# Patient Record
Sex: Female | Born: 1950 | Race: White | Hispanic: No | Marital: Married | Smoking: Former smoker
Health system: Southern US, Community
[De-identification: ages and names within clinical notes are randomized; demographics above are authoritative.]

## PROBLEM LIST (undated history)

## (undated) DIAGNOSIS — Z5189 Encounter for other specified aftercare: Secondary | ICD-10-CM

## (undated) DIAGNOSIS — D649 Anemia, unspecified: Secondary | ICD-10-CM

## (undated) DIAGNOSIS — R42 Dizziness and giddiness: Secondary | ICD-10-CM

## (undated) DIAGNOSIS — I10 Essential (primary) hypertension: Secondary | ICD-10-CM

## (undated) DIAGNOSIS — M810 Age-related osteoporosis without current pathological fracture: Secondary | ICD-10-CM

## (undated) HISTORY — DX: Encounter for other specified aftercare: Z51.89

## (undated) HISTORY — DX: Age-related osteoporosis without current pathological fracture: M81.0

## (undated) HISTORY — PX: FRACTURE SURGERY: SHX138

## (undated) HISTORY — DX: Essential (primary) hypertension: I10

## (undated) HISTORY — DX: Dizziness and giddiness: R42

## (undated) HISTORY — DX: Anemia, unspecified: D64.9

## (undated) HISTORY — PX: HALLUX VALGUS CORRECTION: SUR315

---

## 1999-07-23 ENCOUNTER — Encounter: Payer: Self-pay | Admitting: Occupational Medicine

## 1999-07-23 ENCOUNTER — Encounter: Admission: RE | Admit: 1999-07-23 | Discharge: 1999-07-23 | Payer: Self-pay | Admitting: Occupational Medicine

## 2004-05-21 ENCOUNTER — Encounter: Admission: RE | Admit: 2004-05-21 | Discharge: 2004-05-21 | Payer: Self-pay | Admitting: Family Medicine

## 2005-01-12 ENCOUNTER — Other Ambulatory Visit: Admission: RE | Admit: 2005-01-12 | Discharge: 2005-01-12 | Payer: Self-pay | Admitting: Family Medicine

## 2005-08-31 ENCOUNTER — Encounter: Admission: RE | Admit: 2005-08-31 | Discharge: 2005-08-31 | Payer: Self-pay | Admitting: Occupational Medicine

## 2006-09-29 ENCOUNTER — Encounter: Admission: RE | Admit: 2006-09-29 | Discharge: 2006-09-29 | Payer: Self-pay | Admitting: Family Medicine

## 2009-07-18 ENCOUNTER — Inpatient Hospital Stay (HOSPITAL_COMMUNITY): Admission: EM | Admit: 2009-07-18 | Discharge: 2009-07-19 | Payer: Self-pay | Admitting: Emergency Medicine

## 2009-08-15 ENCOUNTER — Inpatient Hospital Stay (HOSPITAL_COMMUNITY): Admission: EM | Admit: 2009-08-15 | Discharge: 2009-08-18 | Payer: Self-pay | Admitting: Emergency Medicine

## 2010-03-21 LAB — CBC
HCT: 32.5 % — ABNORMAL LOW (ref 36.0–46.0)
HCT: 40.9 % (ref 36.0–46.0)
MCH: 30.6 pg (ref 26.0–34.0)
MCH: 30.7 pg (ref 26.0–34.0)
MCHC: 34.2 g/dL (ref 30.0–36.0)
MCHC: 34.5 g/dL (ref 30.0–36.0)
MCV: 88.9 fL (ref 78.0–100.0)
MCV: 89.5 fL (ref 78.0–100.0)
RDW: 13.5 % (ref 11.5–15.5)
WBC: 7.4 10*3/uL (ref 4.0–10.5)

## 2010-03-21 LAB — BASIC METABOLIC PANEL
CO2: 23 mEq/L (ref 19–32)
Chloride: 109 mEq/L (ref 96–112)
Creatinine, Ser: 0.69 mg/dL (ref 0.4–1.2)
Glucose, Bld: 98 mg/dL (ref 70–99)

## 2010-03-21 LAB — POCT I-STAT, CHEM 8
Calcium, Ion: 1.09 mmol/L — ABNORMAL LOW (ref 1.12–1.32)
Glucose, Bld: 90 mg/dL (ref 70–99)

## 2010-03-21 LAB — DIFFERENTIAL
Basophils Absolute: 0 10*3/uL (ref 0.0–0.1)
Eosinophils Absolute: 0 10*3/uL (ref 0.0–0.7)
Lymphs Abs: 0.9 10*3/uL (ref 0.7–4.0)
Neutrophils Relative %: 84 % — ABNORMAL HIGH (ref 43–77)

## 2010-03-23 LAB — CBC
HCT: 40.9 % (ref 36.0–46.0)
Hemoglobin: 14 g/dL (ref 12.0–15.0)
MCH: 30.9 pg (ref 26.0–34.0)
MCHC: 34.1 g/dL (ref 30.0–36.0)
MCV: 90.6 fL (ref 78.0–100.0)
Platelets: 185 10*3/uL (ref 150–400)
RBC: 4.52 MIL/uL (ref 3.87–5.11)

## 2010-03-23 LAB — DIFFERENTIAL
Eosinophils Absolute: 0 10*3/uL (ref 0.0–0.7)
Lymphocytes Relative: 10 % — ABNORMAL LOW (ref 12–46)
Lymphs Abs: 1 10*3/uL (ref 0.7–4.0)
Monocytes Absolute: 0.5 10*3/uL (ref 0.1–1.0)

## 2010-03-23 LAB — URINALYSIS, ROUTINE W REFLEX MICROSCOPIC
Bilirubin Urine: NEGATIVE
Glucose, UA: NEGATIVE mg/dL
Hgb urine dipstick: NEGATIVE
Nitrite: NEGATIVE
Protein, ur: NEGATIVE mg/dL
Specific Gravity, Urine: 1.019 (ref 1.005–1.030)

## 2010-03-23 LAB — BASIC METABOLIC PANEL
BUN: 12 mg/dL (ref 6–23)
Calcium: 9.1 mg/dL (ref 8.4–10.5)
GFR calc non Af Amer: >60 mL/min (ref >60)
Glucose, Bld: 119 mg/dL — ABNORMAL HIGH (ref 70–99)
Sodium: 138 mEq/L (ref 135–145)

## 2010-03-23 LAB — URINE MICROSCOPIC-ADD ON

## 2010-03-23 LAB — PROTIME-INR: INR: 1.01 (ref 0.00–1.49)

## 2012-07-12 ENCOUNTER — Ambulatory Visit (INDEPENDENT_AMBULATORY_CARE_PROVIDER_SITE_OTHER): Payer: Federal, State, Local not specified - PPO | Admitting: Family Medicine

## 2012-07-12 VITALS — BP 144/88 | HR 73 | Temp 97.7°F | Resp 18 | Ht 65.0 in | Wt 128.0 lb

## 2012-07-12 DIAGNOSIS — R030 Elevated blood-pressure reading, without diagnosis of hypertension: Secondary | ICD-10-CM

## 2012-07-12 LAB — POCT CBC
Granulocyte percent: 64.6 %G (ref 37–80)
HCT, POC: 47 % (ref 37.7–47.9)
Hemoglobin: 15.1 g/dL (ref 12.2–16.2)
Lymph, poc: 1.1 (ref 0.6–3.4)
MCH, POC: 30.1 pg (ref 27–31.2)
MID (cbc): 0.3 (ref 0–0.9)
POC LYMPH PERCENT: 26.8 %L (ref 10–50)
RBC: 5.01 M/uL (ref 4.04–5.48)
RDW, POC: 13.8 %
WBC: 4 10*3/uL — AB (ref 4.6–10.2)

## 2012-07-12 LAB — COMPREHENSIVE METABOLIC PANEL
AST: 20 U/L (ref 0–37)
Alkaline Phosphatase: 46 U/L (ref 39–117)
BUN: 15 mg/dL (ref 6–23)
Chloride: 104 mEq/L (ref 96–112)
Creat: 0.84 mg/dL (ref 0.50–1.10)
Glucose, Bld: 92 mg/dL (ref 70–99)

## 2012-07-12 LAB — LIPID PANEL
Triglycerides: 79 mg/dL (ref <150)
VLDL: 16 mg/dL (ref 0–40)

## 2012-07-12 NOTE — Progress Notes (Signed)
[facility] [address] [place] [address] [phone]  Date:  07/12/2012   Name:  Lisa Sloan   DOB:  March 14, 1950   MRN:  [id number]  PCP:  No primary provider on file.    Chief Complaint: elevated bp x1 mth   History of Present Illness:  Lisa Sloan is a 62 y.o. very pleasant female patient who presents with the following:  She is here today with concern about high blood pressure.  She went to donate blood abut 3 weeks ago, and was told that her BP was too high for donation. In general her BP has been good in the past.   She has checked her BP a couple of times at the drug store- her diatolic readings are sometimes quite high, up to ?125. She is not sure of the systolic numbers.  Her BP generally ran 120's to 130's/ 60- 80's   She does not see a doctor regularly.  Her father does have HTN.   She walks for exercise- an hour a day.  Tolerating her walking as well as usual.  Over all she feels great, no CP, no SOB.    There are no active problems to display for this patient.   Past Medical History  Diagnosis Date  . Osteoporosis     Past Surgical History  Procedure Laterality Date  . Cesarean section    . Fracture surgery      History  Substance Use Topics  . Smoking status: Never Smoker   . Smokeless tobacco: Not on file  . Alcohol Use: Yes    Family History  Problem Relation Age of Onset  . Diabetes Mother     No Known Allergies  Medication list has been reviewed and updated.  No current outpatient prescriptions on file prior to visit.   No current facility-administered medications on file prior to visit.    Review of Systems:  As per HPI- otherwise negative.   Physical Examination: Filed Vitals:   07/12/12 1156  BP: 144/88  Pulse: 73  Temp: 97.7 F (36.5 C)  Resp: 18   Filed Vitals:   07/12/12 1156  Height: 5\' 5"  (1.651 m)  Weight: 128 lb (58.06 kg)   Body mass index is 21.3 kg/(m^2). Ideal Body  Weight: Weight in (lb) to have BMI = 25: 149.9  GEN: WDWN, NAD, Non-toxic, A & O x 3, appears fit and active.  HEENT: Atraumatic, Normocephalic. Neck supple. No masses, No LAD. Ears and Nose: No external deformity. CV: RRR, No M/G/R. No JVD. No thrill. No extra heart sounds. PULM: CTA B, no wheezes, crackles, rhonchi. No retractions. No resp. distress. No accessory muscle use. ABD: S, NT, ND. No rebound. No HSM. EXTR: No c/c/e NEURO Normal gait.  PSYCH: Normally interactive. Conversant. Not depressed or anxious appearing.  Calm demeanor.   EKG: NSR, no ST elevation or depression  Results for orders placed in visit on 07/12/12  POCT CBC      Result Value Range   WBC 4.0 (*) 4.6 - 10.2 K/uL   Lymph, poc 1.1  0.6 - 3.4   POC LYMPH PERCENT 26.8  10 - 50 %L   MID (cbc) 0.3  0 - 0.9   POC MID % 8.6  0 - 12 %M   POC Granulocyte 2.6  2 - 6.9   Granulocyte percent 64.6  37 - 80 %G   RBC 5.01  4.04 - 5.48 M/uL   Hemoglobin  15.1  12.2 - 16.2 g/dL   HCT, POC 40.9  81.1 - 47.9 %   MCV 93.8  80 - 97 fL   MCH, POC 30.1  27 - 31.2 pg   MCHC 32.1  31.8 - 35.4 g/dL   RDW, POC 91.4     Platelet Count, POC 173  142 - 424 K/uL   MPV 8.5  0 - 99.8 fL    Assessment and Plan: Blood pressure elevated - Plan: POCT CBC, Comprehensive metabolic panel, TSH, Lipid panel, EKG 12-Lead  Lisa Sloan has noted some elevated BP readings.  However, today her BP is acceptable, and she does not want to start medication if not absolutely necessary.  She will obtain more readings at home, and I will follow- up with her when her labs come in.    Signed Abbe Amsterdam, MD

## 2012-07-12 NOTE — Patient Instructions (Addendum)
Please purchase an inexpensive BP cuff.  Check your BP a few times a week- different times of the day are helpful.   Write down your BP readings.  We would like for you to be less than 140/90 on average.  I will be in touch with the rest of your labs.    Please email or call me if your BP is consistently running high

## 2012-07-13 ENCOUNTER — Encounter: Payer: Self-pay | Admitting: Family Medicine

## 2013-11-20 ENCOUNTER — Encounter: Payer: Self-pay | Admitting: Family Medicine

## 2013-11-20 ENCOUNTER — Ambulatory Visit (INDEPENDENT_AMBULATORY_CARE_PROVIDER_SITE_OTHER): Payer: Federal, State, Local not specified - PPO | Admitting: Family Medicine

## 2013-11-20 VITALS — BP 150/98 | HR 89 | Temp 97.4°F | Resp 14 | Ht 64.5 in | Wt 133.0 lb

## 2013-11-20 DIAGNOSIS — R030 Elevated blood-pressure reading, without diagnosis of hypertension: Secondary | ICD-10-CM

## 2013-11-20 DIAGNOSIS — Z1211 Encounter for screening for malignant neoplasm of colon: Secondary | ICD-10-CM

## 2013-11-20 DIAGNOSIS — Z23 Encounter for immunization: Secondary | ICD-10-CM

## 2013-11-20 DIAGNOSIS — IMO0001 Reserved for inherently not codable concepts without codable children: Secondary | ICD-10-CM

## 2013-11-20 DIAGNOSIS — Z1239 Encounter for other screening for malignant neoplasm of breast: Secondary | ICD-10-CM

## 2013-11-20 DIAGNOSIS — Z0000 Encounter for general adult medical examination without abnormal findings: Secondary | ICD-10-CM

## 2013-11-20 DIAGNOSIS — N952 Postmenopausal atrophic vaginitis: Secondary | ICD-10-CM

## 2013-11-20 DIAGNOSIS — Z124 Encounter for screening for malignant neoplasm of cervix: Secondary | ICD-10-CM

## 2013-11-20 LAB — COMPREHENSIVE METABOLIC PANEL
ALT: 13 U/L (ref 0–35)
AST: 21 U/L (ref 0–37)
Albumin: 4.2 g/dL (ref 3.5–5.2)
Alkaline Phosphatase: 51 U/L (ref 39–117)
BUN: 19 mg/dL (ref 6–23)
CALCIUM: 9.6 mg/dL (ref 8.4–10.5)
CO2: 27 mEq/L (ref 19–32)
CREATININE: 0.75 mg/dL (ref 0.50–1.10)
Chloride: 106 mEq/L (ref 96–112)
Glucose, Bld: 72 mg/dL (ref 70–99)
POTASSIUM: 4.1 meq/L (ref 3.5–5.3)
Sodium: 141 mEq/L (ref 135–145)
Total Bilirubin: 0.6 mg/dL (ref 0.2–1.2)
Total Protein: 6.8 g/dL (ref 6.0–8.3)

## 2013-11-20 LAB — POCT URINALYSIS DIPSTICK
BILIRUBIN UA: NEGATIVE
Blood, UA: NEGATIVE
GLUCOSE UA: NEGATIVE
KETONES UA: 15
LEUKOCYTES UA: NEGATIVE
NITRITE UA: NEGATIVE
PH UA: 5
Protein, UA: NEGATIVE
Spec Grav, UA: 1.02
UROBILINOGEN UA: 0.2

## 2013-11-20 LAB — CBC
HCT: 40.6 % (ref 36.0–46.0)
HEMOGLOBIN: 14.2 g/dL (ref 12.0–15.0)
MCH: 30.1 pg (ref 26.0–34.0)
MCHC: 35 g/dL (ref 30.0–36.0)
MCV: 86 fL (ref 78.0–100.0)
MPV: 10.4 fL (ref 9.4–12.4)
Platelets: 153 10*3/uL (ref 150–400)
RBC: 4.72 MIL/uL (ref 3.87–5.11)
RDW: 14 % (ref 11.5–15.5)
WBC: 4 10*3/uL (ref 4.0–10.5)

## 2013-11-20 LAB — TSH: TSH: 1.416 u[IU]/mL (ref 0.350–4.500)

## 2013-11-20 MED ORDER — ESTROGENS, CONJUGATED 0.625 MG/GM VA CREA: 1.0000 | TOPICAL_CREAM | VAGINAL | Status: DC

## 2013-11-20 NOTE — Progress Notes (Signed)
Subjective:    Patient ID: Lisa Sloan, female    DOB: 1950/05/03, 63 y.o.   MRN: [id number]  HPI Patient presents today for CPE.  Last CPE- many years Last PAP- not sure, never abnormal Last mammo- 2008 Colonoscopy- never, is interested in referral for screening Tdap- she wants to check on this, not sure Flu- today  The patient is retired. She works occasionally for a [employer]. She walks for 1 hour most days. She is married.   Her only complaint today is some vaginal dryness and occasional stress urinary incontinence. She has not had any vaginal bleeding since menopause. She has no personal or family history of DVT or clotting disorders. No history of migraine.   She has been told her BP is elevated in the past. She checks it at home and it is usually in the 130-140s/80s.   Past Medical History  Diagnosis Date  . Osteoporosis    Past Surgical History  Procedure Laterality Date  . Cesarean section    . Fracture surgery    . Hallux valgus correction     Family History  Problem Relation Age of Onset  . Diabetes Mother    History  Substance Use Topics  . Smoking status: Never Smoker   . Smokeless tobacco: Not on file  . Alcohol Use: Yes     Review of Systems  Genitourinary:       Vaginal dryness and occasional stress incontinence.   All other systems reviewed and are negative.     Objective:   Physical Exam  Constitutional: She is oriented to person, place, and time. She appears well-developed and well-nourished. No distress.  HENT:  Head: Normocephalic and atraumatic.  Right Ear: Tympanic membrane, external ear and ear canal normal.  Left Ear: Tympanic membrane, external ear and ear canal normal.  Nose: Nose normal.  Mouth/Throat: Oropharynx is clear and moist. No oropharyngeal exudate.  Eyes: Conjunctivae and EOM are normal. Pupils are equal, round, and reactive to light. Right eye exhibits no discharge. Left eye exhibits no discharge. No scleral  icterus.  Neck: Normal range of motion. Neck supple. No JVD present. No thyromegaly present.  Cardiovascular: Normal rate, regular rhythm, normal heart sounds and intact distal pulses.   Pulmonary/Chest: Effort normal and breath sounds normal. Right breast exhibits no inverted nipple, no mass, no nipple discharge, no skin change and no tenderness. Left breast exhibits no inverted nipple, no mass, no nipple discharge, no skin change and no tenderness. Breasts are symmetrical.  Abdominal: Soft. Bowel sounds are normal. She exhibits no distension and no mass. There is no tenderness. There is no rebound and no guarding.  Genitourinary: Rectum normal. No breast swelling, tenderness, discharge or bleeding. Pelvic exam was performed with patient supine. No labial fusion. There is no rash, tenderness, lesion or injury on the right labia. There is no rash, tenderness, lesion or injury on the left labia. Cervix exhibits no discharge. No vaginal discharge found.  Vaginal atrophy.  Musculoskeletal: Normal range of motion.  Lymphadenopathy:    She has no cervical adenopathy.  Neurological: She is alert and oriented to person, place, and time. She has normal reflexes.  Skin: Skin is warm and dry. She is not diaphoretic.  Psychiatric: She has a normal mood and affect. Her behavior is normal. Judgment and thought content normal.  Vitals reviewed. BP 150/98 mmHg  Pulse 89  Temp(Src) 97.4 F (36.3 C) (Oral)  Resp 14  Ht 5' 4.5" (1.638 m)  Wt  133 lb (60.328 kg)  BMI 22.48 kg/m2  SpO2 99%     Assessment & Plan:  1. Annual physical exam - CBC - Comprehensive metabolic panel - TSH - POCT urinalysis dipstick  2. Vaginal atrophy - conjugated estrogens (PREMARIN) vaginal cream; Place 1 Applicatorful vaginally 3 (three) times a week.  Dispense: 42.5 g; Refill: 12  3. Screening for colon cancer - Ambulatory referral to Gastroenterology - Pap IG, CT/NG w/ reflex HPV when ASC-U  4. Screening for breast  cancer - MM DIGITAL SCREENING BILATERAL; Future  5. Elevated blood pressure -Patient is going to take her blood pressure at home for the next 2 weeks, then return to clinic with her readings.  - CBC - Comprehensive metabolic panel - TSH - POCT urinalysis dipstick  6. Flu vaccine need - Flu Vaccine QUAD 36+ mos IM  7. Screening for cervical cancer - Pap IG, CT/NG w/ reflex HPV when ASC-U    Emi Belfast, FNP-BC  [facility], [facility]  11/21/2013 2:08 PM

## 2013-11-20 NOTE — Patient Instructions (Addendum)
Please come in to have your blood pressure checked in 1 week   Conjugated Estrogens vaginal cream What is this medicine? CONJUGATED ESTROGENS (CON ju gate ed ESS troe jenz) are a mixture of female hormones. This cream can help relieve symptoms associated with menopause.like vaginal dryness and irritation. This medicine may be used for other purposes; ask your health care provider or pharmacist if you have questions. COMMON BRAND NAME(S): Premarin What should I tell my health care provider before I take this medicine? They need to know if you have any of these conditions: -abnormal vaginal bleeding -blood vessel disease or blood clots -breast, cervical, endometrial, or uterine cancer -dementia -diabetes -gallbladder disease -heart disease or recent heart attack -high blood pressure -high cholesterol -high level of calcium in the blood -hysterectomy -kidney disease -liver disease -migraine headaches -protein C deficiency -protein S deficiency -stroke -systemic lupus erythematosus (SLE) -tobacco smoker -an unusual or allergic reaction to estrogens other medicines, foods, dyes, or preservatives -pregnant or trying to get pregnant -breast-feeding How should I use this medicine? This medicine is for use in the vagina only. Do not take by mouth. Follow the directions on the prescription label. Use at bedtime unless otherwise directed by your doctor or health care professional. Use the special applicator supplied with the cream. Wash hands before and after use. Fill the applicator with the cream and remove from the tube. Lie on your back, part and bend your knees. Insert the applicator into the vagina and push the plunger to expel the cream into the vagina. Wash the applicator with warm soapy water and rinse well. Use exactly as directed for the complete length of time prescribed. Do not stop using except on the advice of your doctor or health care professional. Talk to your pediatrician  regarding the use of this medicine in children. Special care may be needed. A patient package insert for the product will be given with each prescription and refill. Read this sheet carefully each time. The sheet may change frequently. Overdosage: If you think you have taken too much of this medicine contact a poison control center or emergency room at once. NOTE: This medicine is only for you. Do not share this medicine with others. What if I miss a dose? If you miss a dose, use it as soon as you can. If it is almost time for your next dose, use only that dose. Do not use double or extra doses. What may interact with this medicine? Do not take this medicine with any of the following medications: -aromatase inhibitors like aminoglutethimide, anastrozole, exemestane, letrozole, testolactone This medicine may also interact with the following medications: -barbiturates used for inducing sleep or treating seizures -carbamazepine -grapefruit juice -medicines for fungal infections like itraconazole and ketoconazole -raloxifene or tamoxifen -rifabutin -rifampin -rifapentine -ritonavir -some antibiotics used to treat infections -St. John's Wort -warfarin This list may not describe all possible interactions. Give your health care provider a list of all the medicines, herbs, non-prescription drugs, or dietary supplements you use. Also tell them if you smoke, drink alcohol, or use illegal drugs. Some items may interact with your medicine. What should I watch for while using this medicine? Visit your health care professional for regular checks on your progress. You will need a regular breast and pelvic exam. You should also discuss the need for regular mammograms with your health care professional, and follow his or her guidelines. This medicine can make your body retain fluid, making your fingers, hands, or ankles swell. Your  blood pressure can go up. Contact your doctor or health care professional if  you feel you are retaining fluid. If you have any reason to think you are pregnant; stop taking this medicine at once and contact your doctor or health care professional. Tobacco smoking increases the risk of getting a blood clot or having a stroke, especially if you are more than 62 years old. You are strongly advised not to smoke. If you wear contact lenses and notice visual changes, or if the lenses begin to feel uncomfortable, consult your eye care specialist. If you are going to have elective surgery, you may need to stop taking this medicine beforehand. Consult your health care professional for advice prior to scheduling the surgery. What side effects may I notice from receiving this medicine? Side effects that you should report to your doctor or health care professional as soon as possible: -allergic reactions like skin rash, itching or hives, swelling of the face, lips, or tongue -breast tissue changes or discharge -changes in vision -chest pain -confusion, trouble speaking or understanding -dark urine -general ill feeling or flu-like symptoms -light-colored stools -nausea, vomiting -pain, swelling, warmth in the leg -right upper belly pain -severe headaches -shortness of breath -sudden numbness or weakness of the face, arm or leg -trouble walking, dizziness, loss of balance or coordination -unusual vaginal bleeding -yellowing of the eyes or skin Side effects that usually do not require medical attention (report to your doctor or health care professional if they continue or are bothersome): -hair loss -increased hunger or thirst -increased urination -symptoms of vaginal infection like itching, irritation or unusual discharge -unusually weak or tired This list may not describe all possible side effects. Call your doctor for medical advice about side effects. You may report side effects to FDA at 1-800-FDA-1088. Where should I keep my medicine? Keep out of the reach of  children. Store at room temperature between 15 and 30 degrees C (59 and 86 degrees F). Throw away any unused medicine after the expiration date. NOTE: This sheet is a summary. It may not cover all possible information. If you have questions about this medicine, talk to your doctor, pharmacist, or health care provider.  2015, Elsevier/Gold Standard. (2010-03-26 09:20:36)

## 2013-11-21 LAB — PAP IG, CT-NG, RFX HPV ASCU
Chlamydia Probe Amp: NEGATIVE
GC PROBE AMP: NEGATIVE

## 2013-12-21 ENCOUNTER — Ambulatory Visit
Admission: RE | Admit: 2013-12-21 | Discharge: 2013-12-21 | Disposition: A | Discharge status: Home / Self Care | Payer: Federal, State, Local not specified - PPO | Source: Ambulatory Visit | Attending: Family Medicine | Admitting: Family Medicine

## 2013-12-21 ENCOUNTER — Encounter (INDEPENDENT_AMBULATORY_CARE_PROVIDER_SITE_OTHER): Payer: Self-pay

## 2013-12-21 DIAGNOSIS — Z1239 Encounter for other screening for malignant neoplasm of breast: Secondary | ICD-10-CM

## 2014-01-09 ENCOUNTER — Ambulatory Visit (INDEPENDENT_AMBULATORY_CARE_PROVIDER_SITE_OTHER): Payer: Federal, State, Local not specified - PPO | Admitting: Family Medicine

## 2014-01-09 ENCOUNTER — Encounter: Payer: Self-pay | Admitting: Family Medicine

## 2014-01-09 VITALS — BP 166/106 | HR 90 | Temp 97.5°F | Resp 16 | Ht 65.0 in | Wt 134.6 lb

## 2014-01-09 DIAGNOSIS — I10 Essential (primary) hypertension: Secondary | ICD-10-CM | POA: Insufficient documentation

## 2014-01-09 MED ORDER — HYDROCHLOROTHIAZIDE 25 MG PO TABS: 25.0000 mg | ORAL_TABLET | Freq: Every day | ORAL | Status: DC

## 2014-01-09 NOTE — Progress Notes (Signed)
Subjective:    Patient ID: Lisa Sloan, female    DOB: 03/30/1950, 64 y.o.   MRN: [id number]  HPI Patient presents today for follow up of elevated BP. She took readings at home for about a month and they remained high. Her father, who is in his 48s, has had HTN for as long as she can remember. He has been on the same medication for many years with good control.   She has tried vinegar, an aspirin a day, increased exercise and relaxation without noticeable difference in her readings. She is ready to start medication.   She did not get Rx for estrogen cream filled. Thinks she will continue with OTC lubricants for now.  Review of Systems No chest pain, no SOB, no swelling    Objective:   Physical Exam Physical Exam  Vitals reviewed. Constitutional: She is oriented to person, place, and time. She appears well-developed and well-nourished.  HENT:  Head: Normocephalic and atraumatic.  Eyes: Conjunctivae are normal.  Neck: Normal range of motion. Neck supple.  Cardiovascular: Normal rate.   Pulmonary/Chest: Effort normal.  Musculoskeletal: Normal range of motion.  Neurological: She is alert and oriented to person, place, and time.  Skin: Skin is warm and dry.  Psychiatric: She has a normal mood and affect. Her behavior is normal. Judgment and thought content normal.     Assessment & Plan:  1. Essential hypertension - hydrochlorothiazide (HYDRODIURIL) 25 MG tablet; Take 1 tablet (25 mg total) by mouth daily.  Dispense: 90 tablet; Refill: 1 - CMP from 11/20/13- nml - Discussed possible side effects of medication - Follow up in 3 months- patient will check her blood pressure several times a week at home and bring readings to next appointment.  Emi Belfast, FNP-BC  [facility], [facility]  01/09/2014 2:51 PM

## 2014-04-10 ENCOUNTER — Encounter: Payer: Self-pay | Admitting: Family Medicine

## 2014-04-10 ENCOUNTER — Ambulatory Visit (INDEPENDENT_AMBULATORY_CARE_PROVIDER_SITE_OTHER): Payer: Federal, State, Local not specified - PPO | Admitting: Family Medicine

## 2014-04-10 VITALS — BP 164/92 | HR 91 | Temp 98.0°F | Resp 16 | Ht 64.75 in | Wt 134.0 lb

## 2014-04-10 DIAGNOSIS — I10 Essential (primary) hypertension: Secondary | ICD-10-CM | POA: Diagnosis not present

## 2014-04-10 MED ORDER — ENALAPRIL MALEATE 2.5 MG PO TABS: 2.5000 mg | ORAL_TABLET | Freq: Every day | ORAL | Status: DC

## 2014-04-10 NOTE — Patient Instructions (Signed)
Start enalapril 1 tablet daily. Check your blood pressure a couple of times a week.  I'll see you back in abut a month to check your electrolytes and see if it is working.  Enalapril tablets What is this medicine? ENALAPRIL (e NAL a pril) is an ACE inhibitor. This medicine is used to treat high blood pressure and heart failure. This medicine may be used for other purposes; ask your health care provider or pharmacist if you have questions. COMMON BRAND NAME(S): Vasotec What should I tell my health care provider before I take this medicine? They need to know if you have any of these conditions: -bone marrow disease -heart or blood vessel disease -if you are on a special diet, such as a low salt diet -immune system disease like lupus -kidney or liver disease -low blood pressure -previous swelling of the tongue, face, or lips with difficulty breathing, difficulty swallowing, hoarseness, or tightening of the throat -an unusual or allergic reaction to enalapril, other ACE inhibitors, insect venom, foods, dyes, or preservatives -pregnant or trying to get pregnant -breast-feeding How should I use this medicine? Take this medicine by mouth with a glass of water. Follow the directions on the prescription label. Take your doses at regular intervals. Do not take your medicine more often than directed. Do not stop taking this medicine except on the advice of your doctor or health care professional. Talk to your pediatrician regarding the use of this medicine in children. Special care may be needed. While this drug may be prescribed for children as young as 1 month, precautions do apply. Overdosage: If you think you have taken too much of this medicine contact a poison control center or emergency room at once. NOTE: This medicine is only for you. Do not share this medicine with others. What if I miss a dose? If you miss a dose, take it as soon as you can. If it is almost time for your next dose, take  only that dose. Do not take double or extra doses. What may interact with this medicine? -diuretics -lithium -medicines for high blood pressure -NSAIDs, medicines for pain and inflammation, like ibuprofen or naproxen -potassium salts or potassium supplements This list may not describe all possible interactions. Give your health care provider a list of all the medicines, herbs, non-prescription drugs, or dietary supplements you use. Also tell them if you smoke, drink alcohol, or use illegal drugs. Some items may interact with your medicine. What should I watch for while using this medicine? Visit your doctor or health care professional for regular checks on your progress. Check your blood pressure as directed. Ask your doctor or health care professional what your blood pressure should be and when you should contact him or her. Call your doctor or health care professional if you notice an irregular or fast heart beat. You may get drowsy or dizzy. Do not drive, use machinery, or do anything that needs mental alertness until you know how this drug affects you. Do not stand or sit up quickly, especially if you are an older patient. This reduces the risk of dizzy or fainting spells. Alcohol can make you more drowsy and dizzy. Avoid alcoholic drinks. Women should inform their doctor if they wish to become pregnant or think they might be pregnant. There is a potential for serious side effects to an unborn child. Talk to your health care professional or pharmacist for more information. Check with your doctor or health care professional if you get an attack of  severe diarrhea, nausea and vomiting, or if you sweat a lot. The loss of too much body fluid can make it dangerous for you to take this medicine. Avoid salt substitutes unless you are told otherwise by your doctor or health care professional. Do not treat yourself for coughs, colds, or pain while you are taking this medicine without asking your doctor or  health care professional for advice. Some ingredients may increase your blood pressure. What side effects may I notice from receiving this medicine? Side effects that you should report to your doctor or health care professional as soon as possible: -allergic reactions like skin rash, itching or hives, swelling of the face, lips, or tongue -breathing problems -change in amount of urine passed -chest pain -feeling faint or lightheaded, falls -fever or chills -numbness or tingling in your fingers or toes -redness, blistering, peeling or loosening of the skin, including inside the mouth -swelling of ankles, legs -unusual bleeding or bruising or pinpoint red spots on the skin Side effects that usually do not require medical attention (report to your doctor or health care professional if they continue or are bothersome): -change in sex drive or performance -cough -sun sensitivity -tiredness This list may not describe all possible side effects. Call your doctor for medical advice about side effects. You may report side effects to FDA at 1-800-FDA-1088. Where should I keep my medicine? Keep out of the reach of children. Store at room temperature below 30 degrees C (86 degrees F). Protect from moisture. Keep container tightly closed. Throw away any unused medicine after the expiration date. NOTE: This sheet is a summary. It may not cover all possible information. If you have questions about this medicine, talk to your doctor, pharmacist, or health care provider.  2015, Elsevier/Gold Standard. (2007-04-25 17:37:55)

## 2014-04-14 NOTE — Progress Notes (Signed)
   Subjective:    Patient ID: Lisa Sloan, female    DOB: 01/10/1950, 64 y.o.   MRN: [id number]  HPI This is a very pleasant 64 yo female who presents today for follow up of HTN. She was started on HCTZ 3 months ago. She has been monitoring her blood pressure at home and it continues to consistently run 140-160s/90s. She has tolerated the HCTZ without problems and has not been bothered by mildly increased am urination.   Past Medical History  Diagnosis Date  . Osteoporosis    Past Surgical History  Procedure Laterality Date  . Cesarean section    . Fracture surgery    . Hallux valgus correction     Family History  Problem Relation Age of Onset  . Diabetes Mother    History  Substance Use Topics  . Smoking status: Never Smoker   . Smokeless tobacco: Not on file  . Alcohol Use: Yes     Review of Systems No chest pain, no SOB, no edema. No change in energy level or appetite.     Objective:   Physical Exam  Constitutional: She is oriented to person, place, and time. She appears well-developed and well-nourished.  HENT:  Head: Normocephalic and atraumatic.  Eyes: Conjunctivae are normal.  Neck: Normal range of motion. Neck supple.  Cardiovascular: Normal rate, regular rhythm and normal heart sounds.   Pulmonary/Chest: Effort normal and breath sounds normal.  Musculoskeletal: She exhibits no edema.  Neurological: She is alert and oriented to person, place, and time.  Skin: Skin is warm and dry.  Psychiatric: She has a normal mood and affect. Her behavior is normal. Judgment and thought content normal.  Vitals reviewed.  BP 164/92 mmHg  Pulse 91  Temp(Src) 98 F (36.7 C) (Oral)  Resp 16  Ht 5' 4.75" (1.645 m)  Wt 134 lb (60.782 kg)  BMI 22.46 kg/m2  SpO2 99%     Assessment & Plan:  1. Essential hypertension - enalapril (VASOTEC) 2.5 MG tablet; Take 1 tablet (2.5 mg total) by mouth daily.  Dispense: 30 tablet; Refill: 1 - RTC 4-6 weeks, will check BMP -  patient will check her blood pressure at home and bring recordings  Emi Belfasteborah B. Gessner, FNP-BC  [facility], [facility]  04/14/2014 9:49 PM

## 2014-05-08 ENCOUNTER — Ambulatory Visit: Payer: Federal, State, Local not specified - PPO | Admitting: Family Medicine

## 2014-05-29 ENCOUNTER — Ambulatory Visit: Payer: Federal, State, Local not specified - PPO | Admitting: Family Medicine

## 2014-06-06 ENCOUNTER — Encounter: Payer: Self-pay | Admitting: Family Medicine

## 2014-06-06 ENCOUNTER — Ambulatory Visit (INDEPENDENT_AMBULATORY_CARE_PROVIDER_SITE_OTHER): Payer: Federal, State, Local not specified - PPO | Admitting: Family Medicine

## 2014-06-06 VITALS — BP 122/70 | HR 83 | Temp 98.1°F | Resp 16 | Ht 65.25 in | Wt 134.2 lb

## 2014-06-06 DIAGNOSIS — I10 Essential (primary) hypertension: Secondary | ICD-10-CM | POA: Diagnosis not present

## 2014-06-06 MED ORDER — ENALAPRIL MALEATE 2.5 MG PO TABS: 2.5000 mg | ORAL_TABLET | Freq: Every day | ORAL | Status: DC

## 2014-06-06 MED ORDER — HYDROCHLOROTHIAZIDE 25 MG PO TABS: 25.0000 mg | ORAL_TABLET | Freq: Every day | ORAL | Status: DC

## 2014-06-06 NOTE — Progress Notes (Signed)
Subjective:    Patient ID: Lisa Sloan, female    DOB: 31-Jul-1950, 64 y.o.   MRN: [id number]  HPI Patient presents today for follow up of HTN. She is tolerating medication without side effects.  Had blood work prior to prolia injection yesterday. Will get results tomorrow and send to me.   Review of Systems No chest pain or SOB, no swelling.     Objective:   Physical Exam Physical Exam  Constitutional: Oriented to person, place, and time. She appears well-developed and well-nourished.  HENT:  Head: Normocephalic and atraumatic.  Eyes: Conjunctivae are normal.  Neck: Normal range of motion. Neck supple.  Cardiovascular: Normal rate, regular rhythm and normal heart sounds.   Pulmonary/Chest: Effort normal and breath sounds normal.  Musculoskeletal: Normal range of motion.  Neurological: Alert and oriented to person, place, and time.  Skin: Skin is warm and dry.  Psychiatric: Normal mood and affect. Behavior is normal. Judgment and thought content normal.  Vitals reviewed. BP 122/70 mmHg  Pulse 83  Temp(Src) 98.1 F (36.7 C) (Oral)  Resp 16  Ht 5' 5.25" (1.657 m)  Wt 134 lb 3.2 oz (60.873 kg)  BMI 22.17 kg/m2  SpO2 98%     Assessment & Plan:  1. Essential hypertension - Well controlled on current regimen, she will forward her labs to me - enalapril (VASOTEC) 2.5 MG tablet; Take 1 tablet (2.5 mg total) by mouth daily.  Dispense: 90 tablet; Refill: 1 - hydrochlorothiazide (HYDRODIURIL) 25 MG tablet; Take 1 tablet (25 mg total) by mouth daily.  Dispense: 90 tablet; Refill: 1 - follow up in 6 months for CPE  Olean Ree, FNP-BC  [facility], [facility]  06/06/2014 2:29 PM

## 2014-09-29 ENCOUNTER — Ambulatory Visit (INDEPENDENT_AMBULATORY_CARE_PROVIDER_SITE_OTHER): Payer: Federal, State, Local not specified - PPO | Admitting: Family Medicine

## 2014-09-29 DIAGNOSIS — H01139 Eczematous dermatitis of unspecified eye, unspecified eyelid: Secondary | ICD-10-CM

## 2014-09-29 NOTE — Progress Notes (Addendum)
Subjective:  This chart was scribed for Meredith Staggers, MD by Andrew Au, ED Scribe. This patient was seen in room 10 and the patient's care was started at 9:54 AM.   Patient ID: Lisa Sloan, female    DOB: 07/18/1950, 64 y.o.   MRN: [id number]  HPI  Chief Complaint  Patient presents with  . Eczema    Thinks she's having an outbreak of eczema on eye lids   HPI Comments: Lisa Sloan is a 64 y.o. female who presents to the [facility] complaining of an itchy, erythematous rash along bilateral eyelids noticed 3-4 days ago. Pt reports hx of eczema, usually to sensitive areas including her arms with contact with wool. Pt is on prolia for osteoporosis, and believes medication exacerbates eczema. She has tried an oil based eye make remover, but no other treatments. She uses [employer] products but denies new dermatologic products or make up. She denies wheeze visual changes, blurry vision, eye pain, and eye discharge.   Patient Active Problem List   Diagnosis Date Noted  . Essential hypertension 01/09/2014   Past Medical History  Diagnosis Date  . Osteoporosis    Past Surgical History  Procedure Laterality Date  . Cesarean section    . Fracture surgery    . Hallux valgus correction     No Known Allergies Prior to Admission medications   Medication Sig Start Date End Date Taking? Authorizing Provider  denosumab (PROLIA) 60 MG/ML SOLN injection Inject 60 mg into the skin every 6 (six) months. Administer in upper arm, thigh, or abdomen   Yes Historical Provider, MD  desonide (DESOWEN) 0.05 % cream Apply 1 application topically 2 (two) times daily.   Yes Historical Provider, MD  enalapril (VASOTEC) 2.5 MG tablet Take 1 tablet (2.5 mg total) by mouth daily. 06/06/14  Yes Emi Belfast, FNP  hydrochlorothiazide (HYDRODIURIL) 25 MG tablet Take 1 tablet (25 mg total) by mouth daily. 06/06/14  Yes Emi Belfast, FNP   Social History   Social History  .  Marital Status: Married    Spouse Name: N/A  . Number of Children: N/A  . Years of Education: N/A   Occupational History  . Not on file.   Social History Main Topics  . Smoking status: Never Smoker   . Smokeless tobacco: Not on file  . Alcohol Use: Yes  . Drug Use: No  . Sexual Activity: Not on file   Other Topics Concern  . Not on file   Social History Narrative   Review of Systems  Constitutional: Negative for fever and chills.  Eyes: Negative for photophobia, pain, discharge, redness and visual disturbance.  Respiratory: Negative for wheezing.   Skin: Positive for color change and rash.   Objective:   Physical Exam  Constitutional: She is oriented to person, place, and time. She appears well-developed and well-nourished. No distress.  HENT:  Head: Normocephalic and atraumatic.  Eyes: Conjunctivae and EOM are normal. Pupils are equal, round, and reactive to light. Right eye exhibits no discharge. Left eye exhibits no discharge. Right conjunctiva is not injected. Left conjunctiva is not injected. No scleral icterus. Right eye exhibits no nystagmus. Left eye exhibits no nystagmus.  Medial upper eye lids and medial canthus with erythema, faint scaling and excoritation. Anterior chambers clear.   Neck: Neck supple.  Cardiovascular: Normal rate.   Pulmonary/Chest: Effort normal and breath sounds normal.  Musculoskeletal: Normal range of motion.  Neurological: She is alert  and oriented to person, place, and time.  Skin: Skin is warm and dry.  Psychiatric: She has a normal mood and affect. Her behavior is normal.  Nursing note and vitals reviewed.    Filed Vitals:   09/29/14 0934  BP: 158/96  Pulse: 90  Temp: 98 F (36.7 C)  TempSrc: Oral  Resp: 16  Height: 5' 5.25" (1.657 m)  Weight: 135 lb (61.236 kg)  SpO2: 94%    Assessment & Plan:   Lisa Sloan is a 64 y.o. female Eczematous dermatitis of eyelid, unspecified laterality  Suspected eczema of bilateral  medial upper eyelids versus contact dermatitis.  -She has desonide at home, but it is in the lotion form, and I'm concerned that this may run into her eyes. Would initially try over-the-counter hydrocortisone 1%, avoid getting this into the eyes themselves, use only on the eyelids up to 3 times per day.   -Can also use Lacri-Lube ointment if needed throughout the day as this would be okay if it gets in the eyes.  -Avoid use of eye makeup or any other dermatologic products of these areas for now, and then restart with a new container of her usual product. If return of symptoms,  would consider change to different makeup.  -RTC precautions, and if symptoms persist, may need dermatologic evaluation.  Elevated blood pressure.  -She has history of hypertension, has not taken her medications yet today, denies chest pain dyspnea or weakness. Advised to check blood pressures outside of the office and if remaining over 140/90 when taking medicines, follow-up here or primary care provider.   Meds ordered this encounter  Medications  . desonide (DESOWEN) 0.05 % cream    Sig: Apply 1 application topically 2 (two) times daily.   Patient Instructions  I suspect you have a flare of the eczema on your upper eyelids or a contact dermatitis. Both of these can cause the itching and irritation that you have currently. Because of how close this is to the eyes, we do want to try to avoid steroid cream/lotion or ointment into the eye itself. It may be easiest to try hydrocortisone 1% cream or ointment over-the-counter (using a small amount to just the eyelid and outer areas only as we discussed) 2-3 times per day at the most. You can also apply Lacrilube ointment when not using the steroid cream if needed, as this would be okay if some gets into the eye. Avoid any makeup to this area until your symptoms have completely resolved, then a new container of your usual product. If you have return of symptoms with that makeup,  you may need to switch to a different form of eye makeup.   Return to the clinic or go to the nearest emergency room if any of your symptoms worsen or new symptoms occur.  Eczema Eczema, also called atopic dermatitis, is a skin disorder that causes inflammation of the skin. It causes a red rash and dry, scaly skin. The skin becomes very itchy. Eczema is generally worse during the cooler winter months and often improves with the warmth of summer. Eczema usually starts showing signs in infancy. Some children outgrow eczema, but it may last through adulthood.  CAUSES  The exact cause of eczema is not known, but it appears to run in families. People with eczema often have a family history of eczema, allergies, asthma, or hay fever. Eczema is not contagious. Flare-ups of the condition may be caused by:   Contact with something you  are sensitive or allergic to.   Stress. SIGNS AND SYMPTOMS  Dry, scaly skin.   Red, itchy rash.   Itchiness. This may occur before the skin rash and may be very intense.  DIAGNOSIS  The diagnosis of eczema is usually made based on symptoms and medical history. TREATMENT  Eczema cannot be cured, but symptoms usually can be controlled with treatment and other strategies. A treatment plan might include:  Controlling the itching and scratching.   Use over-the-counter antihistamines as directed for itching. This is especially useful at night when the itching tends to be worse.   Use over-the-counter steroid creams as directed for itching.   Avoid scratching. Scratching makes the rash and itching worse. It may also result in a skin infection (impetigo) due to a break in the skin caused by scratching.   Keeping the skin well moisturized with creams every day. This will seal in moisture and help prevent dryness. Lotions that contain alcohol and water should be avoided because they can dry the skin.   Limiting exposure to things that you are sensitive or  allergic to (allergens).   Recognizing situations that cause stress.   Developing a plan to manage stress.  HOME CARE INSTRUCTIONS   Only take over-the-counter or prescription medicines as directed by your health care provider.   Do not use anything on the skin without checking with your health care provider.   Keep baths or showers short (5 minutes) in warm (not hot) water. Use mild cleansers for bathing. These should be unscented. You may add nonperfumed bath oil to the bath water. It is best to avoid soap and bubble bath.   Immediately after a bath or shower, when the skin is still damp, apply a moisturizing ointment to the entire body. This ointment should be a petroleum ointment. This will seal in moisture and help prevent dryness. The thicker the ointment, the better. These should be unscented.   Keep fingernails cut short. Children with eczema may need to wear soft gloves or mittens at night after applying an ointment.   Dress in clothes made of cotton or cotton blends. Dress lightly, because heat increases itching.   A child with eczema should stay away from anyone with fever blisters or cold sores. The virus that causes fever blisters (herpes simplex) can cause a serious skin infection in children with eczema. SEEK MEDICAL CARE IF:   Your itching interferes with sleep.   Your rash gets worse or is not better within 1 week after starting treatment.   You see pus or soft yellow scabs in the rash area.   You have a fever.   You have a rash flare-up after contact with someone who has fever blisters.  Document Released: 12/20/1999 Document Revised: 10/12/2012 Document Reviewed: 07/25/2012 [facility] Patient Information 2015 [place], [place]. This information is not intended to replace advice given to you by your health care provider. Make sure you discuss any questions you have with your health care provider.  Contact Dermatitis Contact dermatitis is a reaction to  certain substances that touch the skin. Contact dermatitis can be either irritant contact dermatitis or allergic contact dermatitis. Irritant contact dermatitis does not require previous exposure to the substance for a reaction to occur.Allergic contact dermatitis only occurs if you have been exposed to the substance before. Upon a repeat exposure, your body reacts to the substance.  CAUSES  Many substances can cause contact dermatitis. Irritant dermatitis is most commonly caused by repeated exposure to mildly irritating  substances, such as:  Makeup.  Soaps.  Detergents.  Bleaches.  Acids.  Metal salts, such as nickel. Allergic contact dermatitis is most commonly caused by exposure to:  Poisonous plants.  Chemicals (deodorants, shampoos).  Jewelry.  Latex.  Neomycin in triple antibiotic cream.  Preservatives in products, including clothing. SYMPTOMS  The area of skin that is exposed may develop:  Dryness or flaking.  Redness.  Cracks.  Itching.  Pain or a burning sensation.  Blisters. With allergic contact dermatitis, there may also be swelling in areas such as the eyelids, mouth, or genitals.  DIAGNOSIS  Your caregiver can usually tell what the problem is by doing a physical exam. In cases where the cause is uncertain and an allergic contact dermatitis is suspected, a patch skin test may be performed to help determine the cause of your dermatitis. TREATMENT Treatment includes protecting the skin from further contact with the irritating substance by avoiding that substance if possible. Barrier creams, powders, and gloves may be helpful. Your caregiver may also recommend:  Steroid creams or ointments applied 2 times daily. For best results, soak the rash area in cool water for 20 minutes. Then apply the medicine. Cover the area with a plastic wrap. You can store the steroid cream in the refrigerator for a "chilly" effect on your rash. That may decrease itching. Oral  steroid medicines may be needed in more severe cases.  Antibiotics or antibacterial ointments if a skin infection is present.  Antihistamine lotion or an antihistamine taken by mouth to ease itching.  Lubricants to keep moisture in your skin.  Burow's solution to reduce redness and soreness or to dry a weeping rash. Mix one packet or tablet of solution in 2 cups cool water. Dip a clean washcloth in the mixture, wring it out a bit, and put it on the affected area. Leave the cloth in place for 30 minutes. Do this as often as possible throughout the day.  Taking several cornstarch or baking soda baths daily if the area is too large to cover with a washcloth. Harsh chemicals, such as alkalis or acids, can cause skin damage that is like a burn. You should flush your skin for 15 to 20 minutes with cold water after such an exposure. You should also seek immediate medical care after exposure. Bandages (dressings), antibiotics, and pain medicine may be needed for severely irritated skin.  HOME CARE INSTRUCTIONS  Avoid the substance that caused your reaction.  Keep the area of skin that is affected away from hot water, soap, sunlight, chemicals, acidic substances, or anything else that would irritate your skin.  Do not scratch the rash. Scratching may cause the rash to become infected.  You may take cool baths to help stop the itching.  Only take over-the-counter or prescription medicines as directed by your caregiver.  See your caregiver for follow-up care as directed to make sure your skin is healing properly. SEEK MEDICAL CARE IF:   Your condition is not better after 3 days of treatment.  You seem to be getting worse.  You see signs of infection such as swelling, tenderness, redness, soreness, or warmth in the affected area.  You have any problems related to your medicines. Document Released: 12/20/1999 Document Revised: 03/16/2011 Document Reviewed: 05/27/2010 [facility] Patient  Information 2015 [place], [place]. This information is not intended to replace advice given to you by your health care provider. Make sure you discuss any questions you have with your health care provider.  I personally performed the services described in this documentation, which was scribed in my presence. The recorded information has been reviewed and considered, and addended by me as needed.

## 2014-09-29 NOTE — Patient Instructions (Signed)
I suspect you have a flare of the eczema on your upper eyelids or a contact dermatitis. Both of these can cause the itching and irritation that you have currently. Because of how close this is to the eyes, we do want to try to avoid steroid cream/lotion or ointment into the eye itself. It may be easiest to try hydrocortisone 1% cream or ointment over-the-counter (using a small amount to just the eyelid and outer areas only as we discussed) 2-3 times per day at the most. You can also apply Lacrilube ointment when not using the steroid cream if needed, as this would be okay if some gets into the eye. Avoid any makeup to this area until your symptoms have completely resolved, then a new container of your usual product. If you have return of symptoms with that makeup, you may need to switch to a different form of eye makeup.   Return to the clinic or go to the nearest emergency room if any of your symptoms worsen or new symptoms occur.  Eczema Eczema, also called atopic dermatitis, is a skin disorder that causes inflammation of the skin. It causes a red rash and dry, scaly skin. The skin becomes very itchy. Eczema is generally worse during the cooler winter months and often improves with the warmth of summer. Eczema usually starts showing signs in infancy. Some children outgrow eczema, but it may last through adulthood.  CAUSES  The exact cause of eczema is not known, but it appears to run in families. People with eczema often have a family history of eczema, allergies, asthma, or hay fever. Eczema is not contagious. Flare-ups of the condition may be caused by:   Contact with something you are sensitive or allergic to.   Stress. SIGNS AND SYMPTOMS  Dry, scaly skin.   Red, itchy rash.   Itchiness. This may occur before the skin rash and may be very intense.  DIAGNOSIS  The diagnosis of eczema is usually made based on symptoms and medical history. TREATMENT  Eczema cannot be cured, but symptoms  usually can be controlled with treatment and other strategies. A treatment plan might include:  Controlling the itching and scratching.   Use over-the-counter antihistamines as directed for itching. This is especially useful at night when the itching tends to be worse.   Use over-the-counter steroid creams as directed for itching.   Avoid scratching. Scratching makes the rash and itching worse. It may also result in a skin infection (impetigo) due to a break in the skin caused by scratching.   Keeping the skin well moisturized with creams every day. This will seal in moisture and help prevent dryness. Lotions that contain alcohol and water should be avoided because they can dry the skin.   Limiting exposure to things that you are sensitive or allergic to (allergens).   Recognizing situations that cause stress.   Developing a plan to manage stress.  HOME CARE INSTRUCTIONS   Only take over-the-counter or prescription medicines as directed by your health care provider.   Do not use anything on the skin without checking with your health care provider.   Keep baths or showers short (5 minutes) in warm (not hot) water. Use mild cleansers for bathing. These should be unscented. You may add nonperfumed bath oil to the bath water. It is best to avoid soap and bubble bath.   Immediately after a bath or shower, when the skin is still damp, apply a moisturizing ointment to the entire body. This ointment  should be a petroleum ointment. This will seal in moisture and help prevent dryness. The thicker the ointment, the better. These should be unscented.   Keep fingernails cut short. Children with eczema may need to wear soft gloves or mittens at night after applying an ointment.   Dress in clothes made of cotton or cotton blends. Dress lightly, because heat increases itching.   A child with eczema should stay away from anyone with fever blisters or cold sores. The virus that causes  fever blisters (herpes simplex) can cause a serious skin infection in children with eczema. SEEK MEDICAL CARE IF:   Your itching interferes with sleep.   Your rash gets worse or is not better within 1 week after starting treatment.   You see pus or soft yellow scabs in the rash area.   You have a fever.   You have a rash flare-up after contact with someone who has fever blisters.  Document Released: 12/20/1999 Document Revised: 10/12/2012 Document Reviewed: 07/25/2012 [facility] Patient Information 2015 [place], [place]. This information is not intended to replace advice given to you by your health care provider. Make sure you discuss any questions you have with your health care provider.  Contact Dermatitis Contact dermatitis is a reaction to certain substances that touch the skin. Contact dermatitis can be either irritant contact dermatitis or allergic contact dermatitis. Irritant contact dermatitis does not require previous exposure to the substance for a reaction to occur.Allergic contact dermatitis only occurs if you have been exposed to the substance before. Upon a repeat exposure, your body reacts to the substance.  CAUSES  Many substances can cause contact dermatitis. Irritant dermatitis is most commonly caused by repeated exposure to mildly irritating substances, such as:  Makeup.  Soaps.  Detergents.  Bleaches.  Acids.  Metal salts, such as nickel. Allergic contact dermatitis is most commonly caused by exposure to:  Poisonous plants.  Chemicals (deodorants, shampoos).  Jewelry.  Latex.  Neomycin in triple antibiotic cream.  Preservatives in products, including clothing. SYMPTOMS  The area of skin that is exposed may develop:  Dryness or flaking.  Redness.  Cracks.  Itching.  Pain or a burning sensation.  Blisters. With allergic contact dermatitis, there may also be swelling in areas such as the eyelids, mouth, or genitals.  DIAGNOSIS  Your  caregiver can usually tell what the problem is by doing a physical exam. In cases where the cause is uncertain and an allergic contact dermatitis is suspected, a patch skin test may be performed to help determine the cause of your dermatitis. TREATMENT Treatment includes protecting the skin from further contact with the irritating substance by avoiding that substance if possible. Barrier creams, powders, and gloves may be helpful. Your caregiver may also recommend:  Steroid creams or ointments applied 2 times daily. For best results, soak the rash area in cool water for 20 minutes. Then apply the medicine. Cover the area with a plastic wrap. You can store the steroid cream in the refrigerator for a "chilly" effect on your rash. That may decrease itching. Oral steroid medicines may be needed in more severe cases.  Antibiotics or antibacterial ointments if a skin infection is present.  Antihistamine lotion or an antihistamine taken by mouth to ease itching.  Lubricants to keep moisture in your skin.  Burow's solution to reduce redness and soreness or to dry a weeping rash. Mix one packet or tablet of solution in 2 cups cool water. Dip a clean washcloth in the mixture, wring it  out a bit, and put it on the affected area. Leave the cloth in place for 30 minutes. Do this as often as possible throughout the day.  Taking several cornstarch or baking soda baths daily if the area is too large to cover with a washcloth. Harsh chemicals, such as alkalis or acids, can cause skin damage that is like a burn. You should flush your skin for 15 to 20 minutes with cold water after such an exposure. You should also seek immediate medical care after exposure. Bandages (dressings), antibiotics, and pain medicine may be needed for severely irritated skin.  HOME CARE INSTRUCTIONS  Avoid the substance that caused your reaction.  Keep the area of skin that is affected away from hot water, soap, sunlight, chemicals,  acidic substances, or anything else that would irritate your skin.  Do not scratch the rash. Scratching may cause the rash to become infected.  You may take cool baths to help stop the itching.  Only take over-the-counter or prescription medicines as directed by your caregiver.  See your caregiver for follow-up care as directed to make sure your skin is healing properly. SEEK MEDICAL CARE IF:   Your condition is not better after 3 days of treatment.  You seem to be getting worse.  You see signs of infection such as swelling, tenderness, redness, soreness, or warmth in the affected area.  You have any problems related to your medicines. Document Released: 12/20/1999 Document Revised: 03/16/2011 Document Reviewed: 05/27/2010 [facility] Patient Information 2015 [place], [place]. This information is not intended to replace advice given to you by your health care provider. Make sure you discuss any questions you have with your health care provider.

## 2014-12-09 ENCOUNTER — Other Ambulatory Visit: Payer: Self-pay | Admitting: Family Medicine

## 2014-12-11 ENCOUNTER — Ambulatory Visit: Payer: Federal, State, Local not specified - PPO | Admitting: Family Medicine

## 2014-12-19 ENCOUNTER — Ambulatory Visit: Payer: Federal, State, Local not specified - PPO | Admitting: Family Medicine

## 2014-12-27 ENCOUNTER — Other Ambulatory Visit: Payer: Self-pay | Admitting: Family Medicine

## 2015-01-09 ENCOUNTER — Other Ambulatory Visit: Payer: Self-pay | Admitting: Family Medicine

## 2015-01-09 ENCOUNTER — Ambulatory Visit (INDEPENDENT_AMBULATORY_CARE_PROVIDER_SITE_OTHER): Payer: Federal, State, Local not specified - PPO | Admitting: Family Medicine

## 2015-01-09 ENCOUNTER — Encounter: Payer: Self-pay | Admitting: Family Medicine

## 2015-01-09 VITALS — BP 140/90 | HR 92 | Temp 98.1°F | Resp 16 | Ht 65.0 in | Wt 135.0 lb

## 2015-01-09 DIAGNOSIS — Z23 Encounter for immunization: Secondary | ICD-10-CM

## 2015-01-09 DIAGNOSIS — Z1211 Encounter for screening for malignant neoplasm of colon: Secondary | ICD-10-CM | POA: Diagnosis not present

## 2015-01-09 DIAGNOSIS — J0110 Acute frontal sinusitis, unspecified: Secondary | ICD-10-CM | POA: Diagnosis not present

## 2015-01-09 DIAGNOSIS — Z0000 Encounter for general adult medical examination without abnormal findings: Secondary | ICD-10-CM | POA: Diagnosis not present

## 2015-01-09 DIAGNOSIS — I10 Essential (primary) hypertension: Secondary | ICD-10-CM

## 2015-01-09 DIAGNOSIS — L309 Dermatitis, unspecified: Secondary | ICD-10-CM

## 2015-01-09 LAB — CBC
HCT: 42.6 % (ref 36.0–46.0)
Hemoglobin: 14.8 g/dL (ref 12.0–15.0)
MCH: 30.6 pg (ref 26.0–34.0)
MCHC: 34.7 g/dL (ref 30.0–36.0)
MCV: 88 fL (ref 78.0–100.0)
MPV: 9.9 fL (ref 8.6–12.4)
Platelets: 198 10*3/uL (ref 150–400)
RBC: 4.84 MIL/uL (ref 3.87–5.11)
RDW: 13.3 % (ref 11.5–15.5)
WBC: 7 10*3/uL (ref 4.0–10.5)

## 2015-01-09 LAB — LIPID PANEL
CHOLESTEROL: 201 mg/dL — AB (ref 125–200)
HDL: 81 mg/dL (ref >=46)
LDL Cholesterol: 95 mg/dL (ref <130)
TRIGLYCERIDES: 124 mg/dL (ref <150)
Total CHOL/HDL Ratio: 2.5 Ratio (ref <=5.0)
VLDL: 25 mg/dL (ref <30)

## 2015-01-09 LAB — COMPLETE METABOLIC PANEL WITH GFR
ALT: 15 U/L (ref 6–29)
AST: 21 U/L (ref 10–35)
Albumin: 4.4 g/dL (ref 3.6–5.1)
Alkaline Phosphatase: 73 U/L (ref 33–130)
BUN: 13 mg/dL (ref 7–25)
CALCIUM: 9.4 mg/dL (ref 8.6–10.4)
CHLORIDE: 100 mmol/L (ref 98–110)
CO2: 25 mmol/L (ref 20–31)
Creat: 0.71 mg/dL (ref 0.50–0.99)
GFR, Est African American: >89 mL/min (ref >=60)
Glucose, Bld: 92 mg/dL (ref 65–99)
Potassium: 3.9 mmol/L (ref 3.5–5.3)
Sodium: 137 mmol/L (ref 135–146)
Total Bilirubin: 0.7 mg/dL (ref 0.2–1.2)
Total Protein: 7 g/dL (ref 6.1–8.1)

## 2015-01-09 MED ORDER — AMOXICILLIN 875 MG PO TABS: 875.0000 mg | ORAL_TABLET | Freq: Two times a day (BID) | ORAL | Status: DC

## 2015-01-09 MED ORDER — DESONIDE 0.05 % EX CREA: TOPICAL_CREAM | Freq: Two times a day (BID) | CUTANEOUS | Status: DC

## 2015-01-09 NOTE — Progress Notes (Signed)
Subjective:    Patient ID: Lisa Sloan, female    DOB: 01/25/1950, 65 y.o.   MRN: [id number]  HPI This is a pleasant 65 yo female who presents today for CPE. She has been having yellow, thick nasal drainage, post nasal drainage for 3 weeks. No wheeze or SOB.   Last CPE- last year Mammo- 2016 Pap- 2016, normal Colonoscopy- never, will have cologuard Tdap- today Flu- today Eye- regular Dental- regular Exercise- walks several times a week  Past Medical History  Diagnosis Date  . Osteoporosis   . Hypertension    Past Surgical History  Procedure Laterality Date  . Cesarean section    . Fracture surgery    . Hallux valgus correction     Family History  Problem Relation Age of Onset  . Diabetes Mother   . Heart disease Mother   . Hypertension Father   . Cancer Father     colon and skin  . Diabetes Maternal Grandmother    Social History   Social History  . Marital Status: Married    Spouse Name: N/A  . Number of Children: N/A  . Years of Education: N/A   Occupational History  . Not on file.   Social History Main Topics  . Smoking status: Former Smoker -- 1.00 packs/day for 5 years    Types: Cigarettes  . Smokeless tobacco: Not on file  . Alcohol Use: 6.0 oz/week    10 Standard drinks or equivalent per week     Comment: wine  . Drug Use: No  . Sexual Activity: Not on file   Other Topics Concern  . Not on file   Social History Narrative   Exercise walking daily for 1 hour x 3 miles   Review of Systems  Constitutional: Positive for fever.  HENT: Positive for congestion, postnasal drip, rhinorrhea and sinus pressure.   Eyes: Negative.   Respiratory: Negative.   Cardiovascular: Negative.   Endocrine: Negative.   Genitourinary: Negative.   Musculoskeletal: Negative.   Skin: Positive for rash (has intermittent exzema on hands, worse in winter).  Allergic/Immunologic: Negative.   Neurological: Positive for headaches (with recent illness).    Hematological: Negative.   Psychiatric/Behavioral: Negative.       Objective:   Physical Exam  Constitutional: She is oriented to person, place, and time. She appears well-developed and well-nourished. No distress.  HENT:  Head: Normocephalic and atraumatic.  Right Ear: Tympanic membrane, external ear and ear canal normal.  Left Ear: Tympanic membrane, external ear and ear canal normal.  Nose: Mucosal edema and rhinorrhea present. Right sinus exhibits maxillary sinus tenderness. Right sinus exhibits no frontal sinus tenderness. Left sinus exhibits maxillary sinus tenderness. Left sinus exhibits no frontal sinus tenderness.  Mouth/Throat: Uvula is midline. Posterior oropharyngeal erythema present. No oropharyngeal exudate or posterior oropharyngeal edema.  Neck: Normal range of motion. Neck supple. No thyromegaly present.  Cardiovascular: Normal rate, regular rhythm and normal heart sounds.   Pulmonary/Chest: Effort normal and breath sounds normal. Right breast exhibits no inverted nipple, no mass, no nipple discharge, no skin change and no tenderness. Left breast exhibits no inverted nipple, no mass, no nipple discharge, no skin change and no tenderness. Breasts are symmetrical.  Abdominal: Soft. Bowel sounds are normal. She exhibits no distension and no mass. There is no tenderness. There is no rebound and no guarding.  Musculoskeletal: Normal range of motion. She exhibits no edema.  Lymphadenopathy:    She has no cervical adenopathy.  Neurological:  She is alert and oriented to person, place, and time. She has normal reflexes.  Skin: Skin is warm and dry. Rash (bilateral knuckes with dry, erytematous patchy areas.) noted. She is not diaphoretic.  Psychiatric: She has a normal mood and affect. Her behavior is normal. Judgment and thought content normal.  Vitals reviewed.  BP 140/90 mmHg  Pulse 92  Temp(Src) 98.1 F (36.7 C) (Oral)  Resp 16  Ht 5\' 5"  (1.651 m)  Wt 135 lb (61.236 kg)   BMI 22.47 kg/m2  SpO2 97% Wt Readings from Last 3 Encounters:  01/09/15 135 lb (61.236 kg)  09/29/14 135 lb (61.236 kg)  06/06/14 134 lb 3.2 oz (60.873 kg)       Assessment & Plan:  1. Annual physical exam - encouraged her to continue healthy food choices, regular exercise, regular sleep  2. Eczema - desonide (DESOWEN) 0.05 % cream; Apply topically 2 (two) times daily. For maximum 10 days.  Dispense: 60 g; Refill: 1  3. Essential hypertension - CBC - COMPLETE METABOLIC PANEL WITH GFR - Lipid panel  4. Acute frontal sinusitis, recurrence not specified - amoxicillin (AMOXIL) 875 MG tablet; Take 1 tablet (875 mg total) by mouth 2 (two) times daily.  Dispense: 20 tablet; Refill: 0 - RTC precautions reviewed  5. Needs flu shot - Flu Vaccine QUAD 36+ mos IM  6. Need for Tdap vaccination - Tdap today  7. Screening for colon cancer - will do Cologuard screening. Patient aware that positive result would necessitate follow up with colonoscopy and patient is agreeable.   - follow up HTN in 6 months   Olean Ree, FNP-BC  [facility], [facility]  01/12/2015 12:32 PM

## 2015-01-15 ENCOUNTER — Telehealth: Payer: Self-pay | Admitting: *Deleted

## 2015-01-15 NOTE — Telephone Encounter (Signed)
Called and lmomtcb for the pt to discuss the cologuard test with her.  Will need to mail a copy to her for her to sign have her mail this back to us so we may send this in.

## 2015-01-21 NOTE — Telephone Encounter (Signed)
Called and lmom to make the pt aware that the cologuard form will be mailed to her so she may sign this and pt is aware to mail this back to us to be sent in along with her information.

## 2015-06-26 ENCOUNTER — Other Ambulatory Visit: Payer: Self-pay | Admitting: Family Medicine

## 2015-07-02 ENCOUNTER — Ambulatory Visit: Payer: Self-pay | Admitting: Family Medicine

## 2015-07-10 ENCOUNTER — Ambulatory Visit: Payer: Self-pay | Admitting: Family Medicine

## 2015-07-10 ENCOUNTER — Ambulatory Visit (INDEPENDENT_AMBULATORY_CARE_PROVIDER_SITE_OTHER): Payer: Federal, State, Local not specified - PPO | Admitting: Family Medicine

## 2015-07-10 ENCOUNTER — Encounter: Payer: Self-pay | Admitting: Family Medicine

## 2015-07-10 VITALS — BP 116/74 | HR 84 | Temp 97.7°F | Resp 16 | Ht 64.4 in | Wt 133.6 lb

## 2015-07-10 DIAGNOSIS — M810 Age-related osteoporosis without current pathological fracture: Secondary | ICD-10-CM

## 2015-07-10 DIAGNOSIS — L309 Dermatitis, unspecified: Secondary | ICD-10-CM | POA: Diagnosis not present

## 2015-07-10 DIAGNOSIS — I10 Essential (primary) hypertension: Secondary | ICD-10-CM

## 2015-07-10 LAB — POCT URINALYSIS DIP (MANUAL ENTRY)
Bilirubin, UA: NEGATIVE
Blood, UA: NEGATIVE
Glucose, UA: NEGATIVE
Ketones, POC UA: NEGATIVE
Nitrite, UA: NEGATIVE
Protein Ur, POC: NEGATIVE
Spec Grav, UA: <=1.005
Urobilinogen, UA: 0.2
pH, UA: 6

## 2015-07-10 LAB — COMPREHENSIVE METABOLIC PANEL
ALBUMIN: 4.8 g/dL (ref 3.6–5.1)
ALT: 17 U/L (ref 6–29)
AST: 20 U/L (ref 10–35)
Alkaline Phosphatase: 46 U/L (ref 33–130)
BUN: 14 mg/dL (ref 7–25)
CHLORIDE: 101 mmol/L (ref 98–110)
CO2: 27 mmol/L (ref 20–31)
Calcium: 9.8 mg/dL (ref 8.6–10.4)
Creat: 0.7 mg/dL (ref 0.50–0.99)
Glucose, Bld: 102 mg/dL — ABNORMAL HIGH (ref 65–99)
POTASSIUM: 4.7 mmol/L (ref 3.5–5.3)
Sodium: 138 mmol/L (ref 135–146)
Total Bilirubin: 0.8 mg/dL (ref 0.2–1.2)
Total Protein: 7.5 g/dL (ref 6.1–8.1)

## 2015-07-10 LAB — POC MICROSCOPIC URINALYSIS (UMFC): MUCUS RE: ABSENT

## 2015-07-10 LAB — CBC WITH DIFFERENTIAL/PLATELET
Basophils Absolute: 37 {cells}/uL (ref 0–200)
Basophils Relative: 1 %
Eosinophils Absolute: 37 {cells}/uL (ref 15–500)
Eosinophils Relative: 1 %
HCT: 45.6 % — ABNORMAL HIGH (ref 35.0–45.0)
Hemoglobin: 16 g/dL — ABNORMAL HIGH (ref 11.7–15.5)
Lymphocytes Relative: 24 %
Lymphs Abs: 888 {cells}/uL (ref 850–3900)
MCH: 30.4 pg (ref 27.0–33.0)
MCHC: 35.1 g/dL (ref 32.0–36.0)
MCV: 86.5 fL (ref 80.0–100.0)
MPV: 10.1 fL (ref 7.5–12.5)
Monocytes Absolute: 407 {cells}/uL (ref 200–950)
Monocytes Relative: 11 %
Neutro Abs: 2331 {cells}/uL (ref 1500–7800)
Neutrophils Relative %: 63 %
Platelets: 176 K/uL (ref 140–400)
RBC: 5.27 MIL/uL — ABNORMAL HIGH (ref 3.80–5.10)
RDW: 13.8 % (ref 11.0–15.0)
WBC: 3.7 K/uL — ABNORMAL LOW (ref 3.8–10.8)

## 2015-07-10 LAB — TSH: TSH: 1.59 mIU/L

## 2015-07-10 MED ORDER — HYDROCHLOROTHIAZIDE 25 MG PO TABS: 25.0000 mg | ORAL_TABLET | Freq: Every day | ORAL | Status: DC

## 2015-07-10 MED ORDER — ENALAPRIL MALEATE 2.5 MG PO TABS: 2.5000 mg | ORAL_TABLET | Freq: Every day | ORAL | Status: DC

## 2015-07-10 NOTE — Progress Notes (Signed)
Subjective:    Patient ID: Lisa Sloan, female    DOB: 05/11/1950, 65 y.o.   MRN: [id number]  07/10/2015  Follow-up (blood pressure)   HPI This 65 y.o. female presents for follow-up hypertension.  Home readings run 120/80.  Checks BP every other week.  Exercising daily; yard work and pulling and cutting.  Walking 1 hour per day.    Eczema: chronic issue.  Taking Desonide.    Osteoporosis: Prolia every six months; onset 2010; Dr. Albertha Ghee.  Followed twice yearly.   Review of Systems  Constitutional: Negative for fever, chills, diaphoresis and fatigue.  Eyes: Negative for visual disturbance.  Respiratory: Negative for cough and shortness of breath.   Cardiovascular: Negative for chest pain, palpitations and leg swelling.  Gastrointestinal: Negative for nausea, vomiting, abdominal pain, diarrhea and constipation.  Endocrine: Negative for cold intolerance, heat intolerance, polydipsia, polyphagia and polyuria.  Skin: Positive for rash.  Neurological: Negative for dizziness, tremors, seizures, syncope, facial asymmetry, speech difficulty, weakness, light-headedness, numbness and headaches.    Past Medical History:  Diagnosis Date  . Hypertension   . Osteoporosis    Past Surgical History:  Procedure Laterality Date  . CESAREAN SECTION    . FRACTURE SURGERY    . HALLUX VALGUS CORRECTION     No Known Allergies Current Outpatient Prescriptions  Medication Sig Dispense Refill  . denosumab (PROLIA) 60 MG/ML SOLN injection Inject 60 mg into the skin every 6 (six) months. Administer in upper arm, thigh, or abdomen    . desonide (DESOWEN) 0.05 % cream Apply topically 2 (two) times daily. For maximum 10 days. 60 g 1  . enalapril (VASOTEC) 2.5 MG tablet Take 1 tablet (2.5 mg total) by mouth daily. 90 tablet 1  . hydrochlorothiazide (HYDRODIURIL) 25 MG tablet Take 1 tablet (25 mg total) by mouth daily. 90 tablet 1   No current facility-administered medications for this visit.     Social History   Social History  . Marital status: Married    Spouse name: N/A  . Number of children: N/A  . Years of education: N/A   Occupational History  . Not on file.   Social History Main Topics  . Smoking status: Former Smoker    Packs/day: 1.00    Years: 5.00    Types: Cigarettes  . Smokeless tobacco: Not on file  . Alcohol use 6.0 oz/week    10 Standard drinks or equivalent per week     Comment: wine  . Drug use: No  . Sexual activity: Not on file   Other Topics Concern  . 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 Diabetes Mother   . Heart disease Mother     diabetic induced  . Hypertension Father   . Cancer Father 67    colon and skin  . Dementia Father   . Heart disease Father   . Kidney failure Father   . Diabetes Maternal Grandmother        Objective:  BP 116/74   Pulse 84   Temp 97.7 F (36.5 C) (Oral)   Resp 16   Ht 5' 4.4" (1.636 m)   Wt 133 lb 9.6 oz (60.6 kg)   SpO2 97%   BMI 22.65 kg/m  Physical Exam  Constitutional: She is oriented to person, place, and time. She appears well-developed and well-nourished. No distress.  HENT:  Head: Normocephalic and atraumatic.  Right Ear: External ear normal.  Left Ear: External ear normal.  Nose: Nose normal.  Mouth/Throat: Oropharynx is clear and moist.  Eyes: Conjunctivae and EOM are normal. Pupils are equal, round, and reactive to light.  Neck: Normal range of motion. Neck supple. Carotid bruit is not present. No thyromegaly present.  Cardiovascular: Normal rate, regular rhythm, normal heart sounds and intact distal pulses.  Exam reveals  no gallop and no friction rub.   No murmur heard. Pulmonary/Chest: Effort normal and breath sounds normal. She has no wheezes. She has no rales.  Abdominal: Soft. Bowel sounds are normal. She exhibits no distension and no mass. There is no tenderness. There is no rebound and no guarding.  Lymphadenopathy:    She has no cervical adenopathy.  Neurological: She is alert and oriented to person, place, and time. No cranial nerve deficit.  Skin: Skin is warm and dry. No rash noted. She is not diaphoretic. No erythema. No pallor.  Psychiatric: She has a normal mood and affect. Her behavior is normal.   Depression screen [facility] 2/9 07/10/2015 09/29/2014 06/06/2014 01/09/2014  Decreased Interest 0 0 0 0  Down, Depressed, Hopeless 0 0 0 0  PHQ - 2 Score 0 0 0 0       Assessment & Plan:   1. Essential hypertension   2. Osteoporosis   3. Eczema    -controlled. -obtain labs. -refills provided.   Orders Placed This Encounter  Procedures  . Comprehensive metabolic panel  . TSH  . CBC with Differential/Platelet  . POCT urinalysis dipstick  . POCT Microscopic Urinalysis ([facility])   Meds ordered this encounter  Medications  . hydrochlorothiazide (HYDRODIURIL) 25 MG tablet    Sig: Take 1 tablet (25 mg total) by mouth daily.    Dispense:  90 tablet    Refill:  1  . enalapril (VASOTEC) 2.5 MG tablet    Sig: Take 1 tablet (2.5 mg total) by mouth daily.    Dispense:  90 tablet    Refill:  1    Return in about 6 months (around 01/10/2016) for complete physical examiniation.   Weylyn Ricciuti Paulita Fujita, M.D. [facility] [address] [place], [place]  [id number] [phone] phone [phone] fax

## 2015-07-10 NOTE — Patient Instructions (Signed)
     IF you received an x-ray today, you will receive an invoice from [facility]. Please contact [facility] at [phone] with questions or concerns regarding your invoice.   IF you received labwork today, you will receive an invoice from [facility]. Please contact [facility] at [phone] with questions or concerns regarding your invoice.   Our billing staff will not be able to assist you with questions regarding bills from these companies.  You will be contacted with the lab results as soon as they are available. The fastest way to get your results is to activate your My Chart account. Instructions are located on the last page of this paperwork. If you have not heard from us regarding the results in 2 weeks, please contact this office.      

## 2015-08-04 DIAGNOSIS — L309 Dermatitis, unspecified: Secondary | ICD-10-CM | POA: Insufficient documentation

## 2015-08-04 DIAGNOSIS — M810 Age-related osteoporosis without current pathological fracture: Secondary | ICD-10-CM | POA: Insufficient documentation

## 2015-08-12 ENCOUNTER — Emergency Department (HOSPITAL_COMMUNITY)
Admission: EM | Admit: 2015-08-12 | Discharge: 2015-08-12 | Disposition: A | Discharge status: Home / Self Care | Payer: Federal, State, Local not specified - PPO | Attending: Emergency Medicine | Admitting: Emergency Medicine

## 2015-08-12 ENCOUNTER — Encounter (HOSPITAL_COMMUNITY): Payer: Self-pay | Admitting: *Deleted

## 2015-08-12 ENCOUNTER — Emergency Department (HOSPITAL_COMMUNITY): Payer: Federal, State, Local not specified - PPO

## 2015-08-12 DIAGNOSIS — Z87891 Personal history of nicotine dependence: Secondary | ICD-10-CM | POA: Insufficient documentation

## 2015-08-12 DIAGNOSIS — I10 Essential (primary) hypertension: Secondary | ICD-10-CM | POA: Diagnosis not present

## 2015-08-12 DIAGNOSIS — H9313 Tinnitus, bilateral: Secondary | ICD-10-CM | POA: Insufficient documentation

## 2015-08-12 DIAGNOSIS — I639 Cerebral infarction, unspecified: Secondary | ICD-10-CM | POA: Insufficient documentation

## 2015-08-12 DIAGNOSIS — E876 Hypokalemia: Secondary | ICD-10-CM | POA: Diagnosis not present

## 2015-08-12 DIAGNOSIS — Z7982 Long term (current) use of aspirin: Secondary | ICD-10-CM | POA: Diagnosis not present

## 2015-08-12 DIAGNOSIS — Z79899 Other long term (current) drug therapy: Secondary | ICD-10-CM | POA: Diagnosis not present

## 2015-08-12 DIAGNOSIS — R42 Dizziness and giddiness: Secondary | ICD-10-CM | POA: Diagnosis present

## 2015-08-12 LAB — CBC
HCT: 42.2 % (ref 36.0–46.0)
Hemoglobin: 14.9 g/dL (ref 12.0–15.0)
MCH: 30.5 pg (ref 26.0–34.0)
MCHC: 35.3 g/dL (ref 30.0–36.0)
MCV: 86.5 fL (ref 78.0–100.0)
Platelets: 179 10*3/uL (ref 150–400)
RBC: 4.88 MIL/uL (ref 3.87–5.11)
RDW: 13 % (ref 11.5–15.5)
WBC: 4.9 10*3/uL (ref 4.0–10.5)

## 2015-08-12 LAB — COMPREHENSIVE METABOLIC PANEL
ALK PHOS: 55 U/L (ref 38–126)
ALT: 19 U/L (ref 14–54)
AST: 25 U/L (ref 15–41)
Albumin: 4.9 g/dL (ref 3.5–5.0)
Anion gap: 12 (ref 5–15)
BILIRUBIN TOTAL: 0.8 mg/dL (ref 0.3–1.2)
BUN: 25 mg/dL — AB (ref 6–20)
CALCIUM: 9.7 mg/dL (ref 8.9–10.3)
CO2: 24 mmol/L (ref 22–32)
CREATININE: 0.9 mg/dL (ref 0.44–1.00)
Chloride: 102 mmol/L (ref 101–111)
GFR calc Af Amer: >60 mL/min (ref >60)
Glucose, Bld: 145 mg/dL — ABNORMAL HIGH (ref 65–99)
POTASSIUM: 3.4 mmol/L — AB (ref 3.5–5.1)
Sodium: 138 mmol/L (ref 135–145)
TOTAL PROTEIN: 7.7 g/dL (ref 6.5–8.1)

## 2015-08-12 LAB — I-STAT CHEM 8, ED
BUN: 23 mg/dL — ABNORMAL HIGH (ref 6–20)
CALCIUM ION: 1.11 mmol/L — AB (ref 1.12–1.23)
Chloride: 100 mmol/L — ABNORMAL LOW (ref 101–111)
Creatinine, Ser: 0.9 mg/dL (ref 0.44–1.00)
Glucose, Bld: 143 mg/dL — ABNORMAL HIGH (ref 65–99)
HEMATOCRIT: 45 % (ref 36.0–46.0)
Hemoglobin: 15.3 g/dL — ABNORMAL HIGH (ref 12.0–15.0)
Potassium: 3.4 mmol/L — ABNORMAL LOW (ref 3.5–5.1)
SODIUM: 138 mmol/L (ref 135–145)
TCO2: 22 mmol/L (ref 0–100)

## 2015-08-12 LAB — DIFFERENTIAL
BASOS ABS: 0 10*3/uL (ref 0.0–0.1)
Basophils Relative: 0 %
EOS ABS: 0 10*3/uL (ref 0.0–0.7)
Eosinophils Relative: 0 %
LYMPHS ABS: 0.9 10*3/uL (ref 0.7–4.0)
LYMPHS PCT: 18 %
MONOS PCT: 6 %
Monocytes Absolute: 0.3 10*3/uL (ref 0.1–1.0)
Neutro Abs: 3.7 10*3/uL (ref 1.7–7.7)
Neutrophils Relative %: 76 %

## 2015-08-12 LAB — URINE MICROSCOPIC-ADD ON: RBC / HPF: NONE SEEN RBC/hpf (ref 0–5)

## 2015-08-12 LAB — PROTIME-INR
INR: 0.92
PROTHROMBIN TIME: 12.3 s (ref 11.4–15.2)

## 2015-08-12 LAB — URINALYSIS, ROUTINE W REFLEX MICROSCOPIC
Bilirubin Urine: NEGATIVE
Glucose, UA: NEGATIVE mg/dL
Hgb urine dipstick: NEGATIVE
KETONES UR: 40 mg/dL — AB
NITRITE: NEGATIVE
PROTEIN: NEGATIVE mg/dL
Specific Gravity, Urine: 1.025 (ref 1.005–1.030)
pH: 6.5 (ref 5.0–8.0)

## 2015-08-12 LAB — ETHANOL

## 2015-08-12 LAB — RAPID URINE DRUG SCREEN, HOSP PERFORMED
Amphetamines: NOT DETECTED
BARBITURATES: NOT DETECTED
Benzodiazepines: POSITIVE — AB
COCAINE: NOT DETECTED
Opiates: NOT DETECTED
TETRAHYDROCANNABINOL: NOT DETECTED

## 2015-08-12 LAB — I-STAT TROPONIN, ED: TROPONIN I, POC: 0 ng/mL (ref 0.00–0.08)

## 2015-08-12 LAB — APTT: aPTT: 26 seconds (ref 24–36)

## 2015-08-12 MED ORDER — DIAZEPAM 5 MG PO TABS
2.5000 mg | ORAL_TABLET | Freq: Three times a day (TID) | ORAL | 0 refills | Status: DC | PRN
Start: 2015-08-12 — End: 2016-01-28

## 2015-08-12 MED ORDER — DIAZEPAM 5 MG/ML IJ SOLN
2.5000 mg | Freq: Once | INTRAMUSCULAR | Status: AC
  Administered 2015-08-12: 2.5 mg via INTRAVENOUS
  Filled 2015-08-12: qty 2

## 2015-08-12 MED ORDER — MECLIZINE HCL 25 MG PO TABS
25.0000 mg | ORAL_TABLET | Freq: Once | ORAL | Status: AC
Start: 2015-08-12 — End: 2015-08-12
  Administered 2015-08-12: 25 mg via ORAL
  Filled 2015-08-12: qty 1

## 2015-08-12 MED ORDER — MECLIZINE HCL 25 MG PO TABS: 25.0000 mg | ORAL_TABLET | Freq: Three times a day (TID) | ORAL | 0 refills | Status: DC | PRN

## 2015-08-12 MED ORDER — POTASSIUM CHLORIDE CRYS ER 20 MEQ PO TBCR
40.0000 meq | EXTENDED_RELEASE_TABLET | Freq: Once | ORAL | Status: AC
  Administered 2015-08-12: 40 meq via ORAL
  Filled 2015-08-12: qty 2

## 2015-08-12 MED ORDER — ONDANSETRON HCL 4 MG/2ML IJ SOLN
4.0000 mg | Freq: Once | INTRAMUSCULAR | Status: AC
  Administered 2015-08-12: 4 mg via INTRAVENOUS
  Filled 2015-08-12: qty 2

## 2015-08-12 NOTE — ED Provider Notes (Signed)
[facility]-EMERGENCY DEPT Provider Note   CSN: [id number] Arrival date & time: 08/12/15  1340  First Provider Contact:  First MD Initiated Contact with Patient 08/12/15 1451        History   Chief Complaint Chief Complaint  Patient presents with  . Dizziness    HPI LUANE ROCHON is a 65 y.o. female.  HPI 65 year old female with past medical history of hypertension who presents with acute onset of vertigo. The patient states that her symptoms started on Saturday. She was sitting at her desk when she had an acute onset of room spinning sensation. This lasted approximately 10 minutes and resolved completely. She was then asymptomatic until approximately 11:00 today. She states she was sitting at her desk began when she had acute onset of room spinning sensation. She feels as though she is upside down. She has noticed difficulty walking due to this instability. Denies any vision changes. Denies any other numbness or weakness. Of note, she does endorse bilateral tinnitus for the last several years. She has not seen a doctor for this. She also has an oral fullness sensation in her left ear. No recent head trauma. No recent surgeries`  Past Medical History:  Diagnosis Date  . Hypertension   . Osteoporosis     Patient Active Problem List   Diagnosis Date Noted  . Osteoporosis 08/04/2015  . Eczema 08/04/2015  . Essential hypertension 01/09/2014    Past Surgical History:  Procedure Laterality Date  . CESAREAN SECTION    . FRACTURE SURGERY    . HALLUX VALGUS CORRECTION      OB History    No data available       Home Medications    Prior to Admission medications   Medication Sig Start Date End Date Taking? Authorizing Provider  aspirin 325 MG EC tablet Take 325 mg by mouth daily as needed for pain.   Yes Historical Provider, MD  denosumab (PROLIA) 60 MG/ML SOLN injection Inject 60 mg into the skin every 6 (six) months. Administer in upper arm, thigh, or abdomen   Yes Historical  Provider, MD  desonide (DESOWEN) 0.05 % cream Apply topically 2 (two) times daily. For maximum 10 days. Patient taking differently: Apply 1 application topically 2 (two) times daily as needed (eczema.). For maximum 10 days. 01/09/15  Yes Emi Belfast, FNP  enalapril (VASOTEC) 2.5 MG tablet Take 1 tablet (2.5 mg total) by mouth daily. 07/10/15  Yes Ethelda Chick, MD  hydrochlorothiazide (HYDRODIURIL) 25 MG tablet Take 1 tablet (25 mg total) by mouth daily. 07/10/15  Yes Ethelda Chick, MD  Multiple Vitamin (MULTIVITAMIN WITH MINERALS) TABS tablet Take 1 tablet by mouth daily.   Yes Historical Provider, MD  diazepam (VALIUM) 5 MG tablet Take 0.5-1 tablets (2.5-5 mg total) by mouth every 8 (eight) hours as needed (Vertigo). Take one half tablet for moderate symptoms. Take one full tablet for severe symptoms. 08/12/15   Shaune Pollack, MD  meclizine (ANTIVERT) 25 MG tablet Take 1 tablet (25 mg total) by mouth 3 (three) times daily as needed for dizziness. 08/12/15   Shaune Pollack, MD    Family History Family History  Problem Relation Age of Onset  . Diabetes Mother   . Heart disease Mother     diabetic induced  . Hypertension Father   . Cancer Father 41    colon and skin  . Dementia Father   . Heart disease Father   . Kidney failure Father   . Diabetes  Maternal Grandmother     Social History Social History  Substance Use Topics  . Smoking status: Former Smoker    Packs/day: 1.00    Years: 5.00    Types: Cigarettes  . Smokeless tobacco: Never Used  . Alcohol use 6.0 oz/week    10 Standard drinks or equivalent per week     Comment: wine     Allergies   Review of patient's allergies indicates no known allergies.   Review of Systems Review of Systems  Constitutional: Positive for fatigue. Negative for chills, diaphoresis and fever.  HENT: Negative for congestion and rhinorrhea.   Eyes: Negative for visual disturbance.  Respiratory: Negative for cough, shortness of breath and  wheezing.   Cardiovascular: Negative for chest pain and leg swelling.  Gastrointestinal: Negative for abdominal pain, diarrhea, nausea and vomiting.  Genitourinary: Negative for dysuria and flank pain.  Musculoskeletal: Positive for gait problem. Negative for neck pain and neck stiffness.  Skin: Negative for rash and wound.  Allergic/Immunologic: Negative for immunocompromised state.  Neurological: Positive for dizziness and light-headedness. Negative for seizures, syncope, facial asymmetry, speech difficulty, weakness, numbness and headaches.     Physical Exam Updated Vital Signs BP 151/76   Pulse 81   Temp 98.4 F (36.9 C) (Oral)   Resp 16   Ht 5\' 6"  (1.676 m)   Wt 133 lb (60.3 kg)   SpO2 100%   BMI 21.47 kg/m   Physical Exam  Constitutional: She is oriented to person, place, and time. She appears well-developed and well-nourished. No distress.  HENT:  Head: Normocephalic and atraumatic.  Mouth/Throat: Oropharynx is clear and moist.  Eyes: Conjunctivae are normal. Pupils are equal, round, and reactive to light.  Neck: Neck supple.  Cardiovascular: Normal rate, regular rhythm and normal heart sounds.  Exam reveals no friction rub.   No murmur heard. Pulmonary/Chest: Effort normal and breath sounds normal. No respiratory distress. She has no wheezes. She has no rales.  Abdominal: She exhibits no distension. There is no tenderness.  Musculoskeletal: She exhibits no edema.  Neurological: She is alert and oriented to person, place, and time. She exhibits normal muscle tone.  Skin: Skin is warm. Capillary refill takes less than 2 seconds.  Nursing note and vitals reviewed.   Neurological Exam:  Mental Status: Alert and oriented to person, place, and time. Attention and concentration normal. Speech clear. Recent memory is intact. Cranial Nerves: Visual fields intact to confrontation in all quadrants bilaterally. EOMI and PERRLA. Leftward nystagmus noted with both leftward and  rightward gaze. Direction of nystagmus does not change.. Facial sensation intact at forehead, maxillary cheek, and chin/mandible bilaterally. No weakness of masticatory muscles. No facial asymmetry or weakness. Hearing grossly normal to finer rub. Uvula is midline, and palate elevates symmetrically. Normal SCM and trapezius strength. Tongue midline without fasciculations Motor: Muscle strength 5/5 in proximal and distal UE and LE bilaterally. No pronator drift. Muscle tone normal. Reflexes: 2+ and symmetrical in all four extremities.  Sensation: Intact to light touch in upper and lower extremities distally bilaterally.  Gait: Normal without ataxia. Coordination: Normal FTN bilaterally. Mild, fine tremor noted in bilateral upper extremities.     ED Treatments / Results  Labs (all labs ordered are listed, but only abnormal results are displayed) Labs Reviewed  COMPREHENSIVE METABOLIC PANEL - Abnormal; Notable for the following:       Result Value   Potassium 3.4 (*)    Glucose, Bld 145 (*)    BUN 25 (*)  All other components within normal limits  URINE RAPID DRUG SCREEN, HOSP PERFORMED - Abnormal; Notable for the following:    Benzodiazepines POSITIVE (*)    All other components within normal limits  URINALYSIS, ROUTINE W REFLEX MICROSCOPIC (NOT AT [facility]) - Abnormal; Notable for the following:    Ketones, ur 40 (*)    Leukocytes, UA MODERATE (*)    All other components within normal limits  URINE MICROSCOPIC-ADD ON - Abnormal; Notable for the following:    Squamous Epithelial / LPF 0-5 (*)    Bacteria, UA FEW (*)    All other components within normal limits  I-STAT CHEM 8, ED - Abnormal; Notable for the following:    Potassium 3.4 (*)    Chloride 100 (*)    BUN 23 (*)    Glucose, Bld 143 (*)    Calcium, Ion 1.11 (*)    Hemoglobin 15.3 (*)    All other components within normal limits  ETHANOL  PROTIME-INR  APTT  CBC  DIFFERENTIAL  I-STAT TROPOININ, ED    EKG  EKG  Interpretation  Date/Time:  Monday August 12 2015 14:58:31 EDT Ventricular Rate:  78 PR Interval:    QRS Duration: 99 QT Interval:  430 QTC Calculation: 490 R Axis:   81 Text Interpretation:  Sinus rhythm Borderline right axis deviation Borderline prolonged QT interval No significant change since last tracing Confirmed by Mattye Verdone MD, Sheria Lang [phone]) on 08/12/2015 3:59:22 PM       Radiology Mr Brain Wo Contrast  Result Date: 08/12/2015 CLINICAL DATA:  Vertigo.  Rule out CVA. EXAM: MRI HEAD WITHOUT CONTRAST TECHNIQUE: Multiplanar, multiecho pulse sequences of the brain and surrounding structures were obtained without intravenous contrast. COMPARISON:  CT head 08/12/2015 FINDINGS: Ventricle size normal.  Cerebral volume normal. Negative for acute infarct. Scattered small white matter hyperintensities consistent with mild chronic microvascular ischemia. Basal ganglia and brainstem intact. Negative for intracranial hemorrhage. Negative for mass or edema.  No shift of the midline structures. Mild mucosal edema right maxillary sinus.  Remaining sinuses clear. Circle of Willis patent with normal flow void in the vertebral and carotid arteries bilaterally. Normal orbital structures.  Pituitary not enlarged. IMPRESSION: No acute intracranial abnormality. Mild chronic microvascular ischemic change in the white matter. Electronically Signed   By: Marlan Palau M.D.   On: 08/12/2015 16:22   Ct Head Code Stroke W/o Cm  Result Date: 08/12/2015 CLINICAL DATA:  Code stroke. Dizziness beginning at 11 a.m. today. Ataxia and vertigo. EXAM: CT HEAD WITHOUT CONTRAST TECHNIQUE: Contiguous axial images were obtained from the base of the skull through the vertex without intravenous contrast. COMPARISON:  None. FINDINGS: Normal appearance of the brain. No evidence of infarct or hemorrhage. No hydrocephalus, mass lesion, or hyperdense vessel. Notable bulky atherosclerotic calcification involving the left V4 segment. Aspects  is not considered useful given reported deficit. No acute mastoiditis or otitis media. No calvarial or orbital findings. These results were called by telephone at the time of interpretation on 08/12/2015 at 3:26 pm to Dr. Shaune Pollack , who verbally acknowledged these results. IMPRESSION: 1. No acute finding. 2. Notable left vertebral atherosclerotic calcification. Electronically Signed   By: Marnee Spring M.D.   On: 08/12/2015 15:28    Procedures Procedures (including critical care time)  Medications Ordered in ED Medications  diazepam (VALIUM) injection 2.5 mg (2.5 mg Intravenous Given 08/12/15 1533)  meclizine (ANTIVERT) tablet 25 mg (25 mg Oral Given 08/12/15 1700)  potassium chloride SA (K-DUR,KLOR-CON) CR tablet 40  mEq (40 mEq Oral Given 08/12/15 1700)  ondansetron (ZOFRAN) injection 4 mg (4 mg Intravenous Given 08/12/15 1707)     Initial Impression / Assessment and Plan / ED Course  I have reviewed the triage vital signs and the nursing notes.  Pertinent labs & imaging results that were available during my care of the patient were reviewed by me and considered in my medical decision making (see chart for details).  Clinical Course  65 year old female with past medical history as above who presents with acute onset of vertigo. See history of present illness above. On arrival, patient noted to have left ward nystagmus and mild bilateral upper extremity tremor. Primary suspicion is inner ear pathology, with primary consideration of Mnire's disease given tinnitus and oral fullness. Must also consider BPPV, although vertigo was not positional. However, given her tremor, which appears to be new, code stroke activated and patient taken immediately to the CT scanner. CT scan shows no acute abnormality. Discussed case with Dr. Hilda BladesArmstrong, of neurology, who recommends MRI. He feels this is likely due to inner ear pathology.  MRI is negative. Labwork reviewed as above. BMP shows mild hypokalemia but is  otherwise unremarkable. EKG is nonischemic. Symptoms are improved with Valium and meclizine. She is able to ambulate in the ED without difficulty. Suspect symptoms are due to inner ear pathology. Will refer for outpatient ENT follow-up. I have given the patient both Valium as well as meclizine at home as well.  Final Clinical Impressions(s) / ED Diagnoses   Final diagnoses:  Tinnitus, bilateral  Vertigo  Hypokalemia    New Prescriptions New Prescriptions   DIAZEPAM (VALIUM) 5 MG TABLET    Take 0.5-1 tablets (2.5-5 mg total) by mouth every 8 (eight) hours as needed (Vertigo). Take one half tablet for moderate symptoms. Take one full tablet for severe symptoms.   MECLIZINE (ANTIVERT) 25 MG TABLET    Take 1 tablet (25 mg total) by mouth 3 (three) times daily as needed for dizziness.     Shaune Pollackameron Apryll Hinkle, MD 08/12/15 [phone]

## 2015-08-12 NOTE — ED Triage Notes (Signed)
Patient c/o dizziness that began at 11 am today.  Patient endorses nausea with movement and changes in vision.  Upper and lower extremity strength = bilat, no arm drift, smile symmetrical, tongue midline.  No dysmetria noted.  PERRLA 3mm.  Left nystagmus noted.  Visual fields intact, EOMI.

## 2015-08-12 NOTE — ED Notes (Signed)
Patient transported to MRI 

## 2015-08-14 ENCOUNTER — Ambulatory Visit (INDEPENDENT_AMBULATORY_CARE_PROVIDER_SITE_OTHER): Payer: Federal, State, Local not specified - PPO | Admitting: Physician Assistant

## 2015-08-14 ENCOUNTER — Telehealth: Payer: Self-pay

## 2015-08-14 VITALS — BP 140/80 | HR 94 | Temp 98.0°F | Resp 18 | Ht 60.0 in | Wt 134.0 lb

## 2015-08-14 DIAGNOSIS — R42 Dizziness and giddiness: Secondary | ICD-10-CM | POA: Diagnosis not present

## 2015-08-14 NOTE — Patient Instructions (Addendum)
Continue meclizine and valium as needed. You will get a phone call about appt with ENT.     IF you received an x-ray today, you will receive an invoice from [facility]. Please contact [facility] at [phone] with questions or concerns regarding your invoice.   IF you received labwork today, you will receive an invoice from [facility]. Please contact [facility] at [phone] with questions or concerns regarding your invoice.   Our billing staff will not be able to assist you with questions regarding bills from these companies.  You will be contacted with the lab results as soon as they are available. The fastest way to get your results is to activate your My Chart account. Instructions are located on the last page of this paperwork. If you have not heard from us regarding the results in 2 weeks, please contact this office.

## 2015-08-14 NOTE — Progress Notes (Signed)
[facility] [address], [place] [address] [phone]  Date:  08/14/2015   Name:  Lisa Sloan   DOB:  1950/04/14   MRN:  [id number]  PCP:  Nilda Simmer, MD    Chief Complaint: Dizziness and Referral (ent)   History of Present Illness:  This is a 65 y.o. female with PMH HTN, osteoporosis, eczema who is presenting with dizziness x 3 days. States she feels like she is under water. Having a hard time walking, feels very unsteady. Dizziness is not as bad as when it started. She was nauseated, and no longer. States if she sits still she is ok. When symptoms started she went to the ED. EKG normal. All labs generally normal including CBC, aptt, INR, troponins, UA, urine drug screen. CT brain and MRI brain normal. This was obtained d/t tremor on neuro exam, she was placed on stroke protocol. She was sent home with valium and meclizine and given a referral to ENT. Valium and meclizine generally just make her sleep and she doesn't see much benefit. She called to make appt with ENT and office told her she would need a referral from a PCP.   Review of Systems:  Review of Systems See HPI  Patient Active Problem List   Diagnosis Date Noted  . Osteoporosis 08/04/2015  . Eczema 08/04/2015  . Essential hypertension 01/09/2014    Prior to Admission medications   Medication Sig Start Date End Date Taking? Authorizing Provider  aspirin 325 MG EC tablet Take 325 mg by mouth daily as needed for pain.   Yes Historical Provider, MD  denosumab (PROLIA) 60 MG/ML SOLN injection Inject 60 mg into the skin every 6 (six) months. Administer in upper arm, thigh, or abdomen   Yes Historical Provider, MD  desonide (DESOWEN) 0.05 % cream Apply topically 2 (two) times daily. For maximum 10 days. Patient taking differently: Apply 1 application topically 2 (two) times daily as needed (eczema.). For maximum 10 days. 01/09/15  Yes Emi Belfast, FNP  diazepam (VALIUM) 5 MG tablet Take  0.5-1 tablets (2.5-5 mg total) by mouth every 8 (eight) hours as needed (Vertigo). Take one half tablet for moderate symptoms. Take one full tablet for severe symptoms. 08/12/15  Yes Shaune Pollack, MD  enalapril (VASOTEC) 2.5 MG tablet Take 1 tablet (2.5 mg total) by mouth daily. 07/10/15  Yes Ethelda Chick, MD  hydrochlorothiazide (HYDRODIURIL) 25 MG tablet Take 1 tablet (25 mg total) by mouth daily. 07/10/15  Yes Ethelda Chick, MD  meclizine (ANTIVERT) 25 MG tablet Take 1 tablet (25 mg total) by mouth 3 (three) times daily as needed for dizziness. 08/12/15  Yes Shaune Pollack, MD  Multiple Vitamin (MULTIVITAMIN WITH MINERALS) TABS tablet Take 1 tablet by mouth daily.   Yes Historical Provider, MD    No Known Allergies  Past Surgical History:  Procedure Laterality Date  . CESAREAN SECTION    . FRACTURE SURGERY    . HALLUX VALGUS CORRECTION      Social History  Substance Use Topics  . Smoking status: Former Smoker    Packs/day: 1.00    Years: 5.00    Types: Cigarettes  . Smokeless tobacco: Never Used  . Alcohol use 6.0 oz/week    10 Standard drinks or equivalent per week     Comment: wine    Family History  Problem Relation Age of Onset  . Diabetes Mother   . Heart disease Mother     diabetic  induced  . Hypertension Father   . Cancer Father [id number]    colon and skin  . Dementia Father   . Heart disease Father   . Kidney failure Father   . Diabetes Maternal Grandmother     Medication list has been reviewed and updated.  Physical Examination:  Physical Exam  Constitutional: She is oriented to person, place, and time. She appears well-developed and well-nourished. No distress.  HENT:  Head: Normocephalic and atraumatic.  Right Ear: Hearing, tympanic membrane, external ear and ear canal normal.  Left Ear: Hearing, tympanic membrane, external ear and ear canal normal.  Nose: Nose normal.  Two rounds of epley maneuvers performed without any improvement in symptoms.  Eyes:  Conjunctivae and lids are normal. Pupils are equal, round, and reactive to light. Right eye exhibits no discharge. Left eye exhibits no discharge. No scleral icterus. Right eye exhibits normal extraocular motion and no nystagmus. Left eye exhibits nystagmus. Left eye exhibits normal extraocular motion.  Pulmonary/Chest: Effort normal. No respiratory distress.  Musculoskeletal: Normal range of motion.  Neurological: She is alert and oriented to person, place, and time.  Skin: Skin is warm, dry and intact. No lesion and no rash noted.  Psychiatric: She has a normal mood and affect. Her speech is normal and behavior is normal. Thought content normal.   BP 140/80 (BP Location: Right Arm, Patient Position: Sitting, Cuff Size: Small)   Pulse 94   Temp 98 F (36.7 C) (Oral)   Resp 18   Ht 5' (1.524 m)   Wt 134 lb (60.8 kg)   SpO2 98%   BMI 26.17 kg/m   Assessment and Plan:  1. Vertigo All work up in ED 3 days ago negative, including CT head and MRI brain. Meclizine and valium with minimal relief. Symptoms overall are improved but not resolved. Epley maneuvers here in office unsuccessful. Referral placed to ENT for further eval. - Ambulatory referral to ENT   Roswell MinersNicole V. Dyke BrackettBush, PA-C, MHS [facility]  08/14/2015

## 2015-08-14 NOTE — Telephone Encounter (Signed)
The patient called to request a referral to ENT for vertigo.  She was seen in the ED on 08/12/15 for vertigo and tinnitus.  She was last seen by Dr Katrinka BlazingSmith on 07/10/15.  She would like to be referred to Dr Jenne PaneBates, if possible.  Please advise, thank you.  CB#: [phone]

## 2015-08-28 ENCOUNTER — Encounter: Payer: Self-pay | Admitting: Family Medicine

## 2015-10-15 ENCOUNTER — Telehealth: Payer: Self-pay

## 2015-10-15 NOTE — Telephone Encounter (Signed)
Attempted to call pt, left VM for pt to call back  

## 2015-10-15 NOTE — Telephone Encounter (Signed)
Patient called wanting a referral to neurology. Has been to ENT and had an eye exam for persistent vertigo. Has also been in emergency room for vertigo. Last seen by Lanier ClamNicole Bush on 08/14/15, previously [facility].   Will call to update once referral is placed, or if she needs an office visit prior to referral.   [phone]

## 2015-10-22 NOTE — Telephone Encounter (Signed)
lmom to cb. 

## 2015-10-23 NOTE — Telephone Encounter (Signed)
Pt has not returned our calls. Sent unable to reach letter. Advised pt it would be best to come in for re-eval for updated notes for referral.

## 2015-10-24 ENCOUNTER — Encounter: Payer: Self-pay | Admitting: Family Medicine

## 2015-10-24 ENCOUNTER — Ambulatory Visit (INDEPENDENT_AMBULATORY_CARE_PROVIDER_SITE_OTHER): Payer: Federal, State, Local not specified - PPO | Admitting: Family Medicine

## 2015-10-24 VITALS — BP 124/80 | HR 83 | Temp 97.5°F | Resp 17 | Ht 60.0 in | Wt 136.0 lb

## 2015-10-24 DIAGNOSIS — R42 Dizziness and giddiness: Secondary | ICD-10-CM

## 2015-10-24 DIAGNOSIS — Z1159 Encounter for screening for other viral diseases: Secondary | ICD-10-CM

## 2015-10-24 DIAGNOSIS — H8113 Benign paroxysmal vertigo, bilateral: Secondary | ICD-10-CM | POA: Diagnosis not present

## 2015-10-24 DIAGNOSIS — Z23 Encounter for immunization: Secondary | ICD-10-CM

## 2015-10-24 DIAGNOSIS — Z114 Encounter for screening for human immunodeficiency virus [HIV]: Secondary | ICD-10-CM

## 2015-10-24 LAB — COMPREHENSIVE METABOLIC PANEL
ALK PHOS: 66 U/L (ref 33–130)
ALT: 17 U/L (ref 6–29)
AST: 28 U/L (ref 10–35)
Albumin: 4.3 g/dL (ref 3.6–5.1)
BILIRUBIN TOTAL: 0.6 mg/dL (ref 0.2–1.2)
BUN: 20 mg/dL (ref 7–25)
CO2: 26 mmol/L (ref 20–31)
CREATININE: 0.79 mg/dL (ref 0.50–0.99)
Calcium: 9.5 mg/dL (ref 8.6–10.4)
Chloride: 102 mmol/L (ref 98–110)
GLUCOSE: 93 mg/dL (ref 65–99)
POTASSIUM: 3.8 mmol/L (ref 3.5–5.3)
Sodium: 139 mmol/L (ref 135–146)
TOTAL PROTEIN: 6.8 g/dL (ref 6.1–8.1)

## 2015-10-24 LAB — CBC WITH DIFFERENTIAL/PLATELET
BASOS PCT: 0 %
Basophils Absolute: 0 cells/uL (ref 0–200)
EOS ABS: 42 {cells}/uL (ref 15–500)
Eosinophils Relative: 1 %
HCT: 42.5 % (ref 35.0–45.0)
Hemoglobin: 14.9 g/dL (ref 11.7–15.5)
Lymphocytes Relative: 23 %
Lymphs Abs: 966 cells/uL (ref 850–3900)
MCH: 30.8 pg (ref 27.0–33.0)
MCHC: 35.1 g/dL (ref 32.0–36.0)
MCV: 87.8 fL (ref 80.0–100.0)
MONO ABS: 504 {cells}/uL (ref 200–950)
MONOS PCT: 12 %
MPV: 10.4 fL (ref 7.5–12.5)
NEUTROS ABS: 2688 {cells}/uL (ref 1500–7800)
Neutrophils Relative %: 64 %
PLATELETS: 201 10*3/uL (ref 140–400)
RBC: 4.84 MIL/uL (ref 3.80–5.10)
RDW: 13.6 % (ref 11.0–15.0)
WBC: 4.2 10*3/uL (ref 3.8–10.8)

## 2015-10-24 LAB — VITAMIN B12: VITAMIN B 12: 403 pg/mL (ref 200–1100)

## 2015-10-24 LAB — HIV ANTIBODY (ROUTINE TESTING W REFLEX): HIV 1&2 Ab, 4th Generation: NONREACTIVE

## 2015-10-24 LAB — HEPATITIS C ANTIBODY: HCV AB: NEGATIVE

## 2015-10-24 NOTE — Patient Instructions (Addendum)
 IF you received an x-ray today, you will receive an invoice from [facility]. Please contact [facility] at [phone] with questions or concerns regarding your invoice.   IF you received labwork today, you will receive an invoice from [facility]. Please contact [facility] at [phone] with questions or concerns regarding your invoice.   Our billing staff will not be able to assist you with questions regarding bills from these companies.  You will be contacted with the lab results as soon as they are available. The fastest way to get your results is to activate your My Chart account. Instructions are located on the last page of this paperwork. If you have not heard from us regarding the results in 2 weeks, please contact this office.  Dizziness Dizziness is a common problem. It is a feeling of unsteadiness or light-headedness. You may feel like you are about to faint. Dizziness can lead to injury if you stumble or fall. Anyone can become dizzy, but dizziness is more common in older adults. This condition can be caused by a number of things, including medicines, dehydration, or illness. HOME CARE INSTRUCTIONS Taking these steps may help with your condition: Eating and Drinking  Drink enough fluid to keep your urine clear or pale yellow. This helps to keep you from becoming dehydrated. Try to drink more clear fluids, such as water.  Do not drink alcohol.  Limit your caffeine intake if directed by your health care provider.  Limit your salt intake if directed by your health care provider. Activity  Avoid making quick movements.  Rise slowly from chairs and steady yourself until you feel okay.  In the morning, first sit up on the side of the bed. When you feel okay, stand slowly while you hold onto something until you know that your balance is fine.  Move your legs often if you need to stand in one place for a long time. Tighten and relax  your muscles in your legs while you are standing.  Do not drive or operate heavy machinery if you feel dizzy.  Avoid bending down if you feel dizzy. Place items in your home so that they are easy for you to reach without leaning over. Lifestyle  Do not use any tobacco products, including cigarettes, chewing tobacco, or electronic cigarettes. If you need help quitting, ask your health care provider.  Try to reduce your stress level, such as with yoga or meditation. Talk with your health care provider if you need help. General Instructions  Watch your dizziness for any changes.  Take medicines only as directed by your health care provider. Talk with your health care provider if you think that your dizziness is caused by a medicine that you are taking.  Tell a friend or a family member that you are feeling dizzy. If he or she notices any changes in your behavior, have this person call your health care provider.  Keep all follow-up visits as directed by your health care provider. This is important. SEEK MEDICAL CARE IF:  Your dizziness does not go away.  Your dizziness or light-headedness gets worse.  You feel nauseous.  You have reduced hearing.  You have new symptoms.  You are unsteady on your feet or you feel like the room is spinning. SEEK IMMEDIATE MEDICAL CARE IF:  You vomit or have diarrhea and are unable to eat or drink anything.  You have problems talking, walking, swallowing, or using your arms, hands, or legs.  You feel   generally weak.  You are not thinking clearly or you have trouble forming sentences. It may take a friend or family member to notice this.  You have chest pain, abdominal pain, shortness of breath, or sweating.  Your vision changes.  You notice any bleeding.  You have a headache.  You have neck pain or a stiff neck.  You have a fever.   This information is not intended to replace advice given to you by your health care provider. Make sure  you discuss any questions you have with your health care provider.   Document Released: 06/17/2000 Document Revised: 05/08/2014 Document Reviewed: 12/18/2013 Elsevier Interactive Patient Education 2016 Elsevier Inc.  

## 2015-10-24 NOTE — Progress Notes (Signed)
Subjective:    Patient ID: Lisa Sloan, female    DOB: 1950-07-13, 65 y.o.   MRN: [id number]  10/24/2015  Dizziness (Pt requesting referral)   HPI This 65 y.o. female presents for evaluation of persistent dizziness.  Evaluated in ED on 08/12/15 for acute onset of vertigo symptoms.  S/p labs and EKG that were normal.  Due to tremor appreciated during exam, CT brain obtained that was negative.  Discussed case with neuoroogy who recommended MRI.  MRI negative in ED.  Discharged on Valium and Meclizine.  The presented on 08/14/15 for ED follow-up; evaluated by PA Bush; referred to ENT.  S/p ENT consultation and s/p physical therapy with ENT office; therapist advised that patient was much better with BPPV.  Yet, patient continues to suffer with daily dizziness/unsteadiness.  Significantly improved from onset in 08/2015 yet continues to suffer with moderate dizziness. Denies fever/chills/sweats. Denies headaches, blurred vision, diplopia, tinnitus, hearing loss, focal numbness or weakness; denies palpitations, chest pain, shortness of breath, nausea, or diaphoresis.  Starting to impact quality of life.  Not as active due to dizziness.  No major stressors currently other than dealing with prolonged dizziness.  Denies nasal congestion, rhinorrhea, sneezing.   BP Readings from Last 3 Encounters:  10/24/15 124/80  08/14/15 140/80  08/12/15 142/89   Wt Readings from Last 3 Encounters:  10/24/15 136 lb (61.7 kg)  08/14/15 134 lb (60.8 kg)  08/12/15 133 lb (60.3 kg)    Review of Systems  Constitutional: Negative for chills, diaphoresis, fatigue and fever.  HENT: Negative for congestion, ear pain, facial swelling, hearing loss, mouth sores, postnasal drip, rhinorrhea, sinus pressure, sore throat, tinnitus and trouble swallowing.   Eyes: Negative for visual disturbance.  Respiratory: Negative for cough and shortness of breath.   Cardiovascular: Negative for chest pain, palpitations and leg swelling.    Gastrointestinal: Negative for abdominal pain, constipation, diarrhea, nausea and vomiting.  Endocrine: Negative for cold intolerance, heat intolerance, polydipsia, polyphagia and polyuria.  Neurological: Positive for dizziness. Negative for tremors, seizures, syncope, facial asymmetry, speech difficulty, weakness, light-headedness, numbness and headaches.  Psychiatric/Behavioral: Negative for dysphoric mood. The patient is not nervous/anxious.     Past Medical History:  Diagnosis Date  . Hypertension   . Osteoporosis    Past Surgical History:  Procedure Laterality Date  . CESAREAN SECTION    . FRACTURE SURGERY    . HALLUX VALGUS CORRECTION     No Known Allergies Current Outpatient Prescriptions  Medication Sig Dispense Refill  . aspirin 325 MG EC tablet Take 325 mg by mouth daily as needed for pain.    Marland Kitchen denosumab (PROLIA) 60 MG/ML SOLN injection Inject 60 mg into the skin every 6 (six) months. Administer in upper arm, thigh, or abdomen    . desonide (DESOWEN) 0.05 % cream Apply topically 2 (two) times daily. For maximum 10 days. (Patient taking differently: Apply 1 application topically 2 (two) times daily as needed (eczema.). For maximum 10 days.) 60 g 1  . enalapril (VASOTEC) 2.5 MG tablet Take 1 tablet (2.5 mg total) by mouth daily. 90 tablet 1  . hydrochlorothiazide (HYDRODIURIL) 25 MG tablet Take 1 tablet (25 mg total) by mouth daily. 90 tablet 1  . Multiple Vitamin (MULTIVITAMIN WITH MINERALS) TABS tablet Take 1 tablet by mouth daily.    . diazepam (VALIUM) 5 MG tablet Take 0.5-1 tablets (2.5-5 mg total) by mouth every 8 (eight) hours as needed (Vertigo). Take one half tablet for moderate symptoms. Take one  full tablet for severe symptoms. (Patient not taking: Reported on 10/24/2015) 10 tablet 0   No current facility-administered medications for this visit.    Social History   Social History  . Marital status: Married    Spouse name: N/A  . Number of children: N/A  .  Years of education: N/A   Occupational History  . Not on file.   Social History Main Topics  . Smoking status: Former Smoker    Packs/day: 1.00    Years: 5.00    Types: Cigarettes  . Smokeless tobacco: Never Used  . Alcohol use 6.0 oz/week    10 Standard drinks or equivalent per week     Comment: wine  . Drug use: No  . Sexual activity: Not on file   Other Topics Concern  . 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 Diabetes Mother   . Heart disease Mother     diabetic induced  . Hypertension Father   . Cancer Father 2    colon and skin  . Dementia Father   . Heart disease Father   . Kidney failure Father   . Diabetes Maternal Grandmother        Objective:    BP 124/80 (BP Location: Right Arm, Patient Position: Sitting, Cuff Size: Normal)   Pulse 83   Temp 97.5 F (36.4 C) (Oral)   Resp 17   Ht 5' (1.524 m)   Wt 136 lb (61.7 kg)   SpO2 97%   BMI 26.56 kg/m  Physical Exam  Constitutional: She is oriented to person, place, and time. She appears well-developed and well-nourished. No distress.  HENT:  Head: Normocephalic and atraumatic.  Right Ear: External ear normal.  Left Ear: External ear normal.  Nose: Nose normal.  Mouth/Throat: Oropharynx is clear and moist.  Eyes: Conjunctivae and EOM are normal. Pupils are equal, round, and reactive to light.  Neck: Normal range of motion. Neck supple. Carotid bruit is not present. No thyromegaly present.  Cardiovascular: Normal rate, regular rhythm, normal heart sounds and intact distal pulses.  Exam reveals no gallop and no friction rub.   No murmur  heard. Pulmonary/Chest: Effort normal and breath sounds normal. She has no wheezes. She has no rales.  Abdominal: Soft. Bowel sounds are normal. She exhibits no distension and no mass. There is no tenderness. There is no rebound and no guarding.  Lymphadenopathy:    She has no cervical adenopathy.  Neurological: She is alert and oriented to person, place, and time. No cranial nerve deficit or sensory deficit. She exhibits normal muscle tone. She displays a negative Romberg sign. Coordination and gait normal.  Skin: Skin is warm and dry. No rash noted. She is not diaphoretic. No erythema. No pallor.  Psychiatric: She has a normal mood and affect. Her behavior is normal.   Results for orders placed or performed in visit on 10/24/15  CBC with Differential/Platelet  Result Value Ref Range   WBC 4.2 3.8 - 10.8 K/uL   RBC  4.84 3.80 - 5.10 MIL/uL   Hemoglobin 14.9 11.7 - 15.5 g/dL   HCT 21.3 08.6 - 57.8 %   MCV 87.8 80.0 - 100.0 fL   MCH 30.8 27.0 - 33.0 pg   MCHC 35.1 32.0 - 36.0 g/dL   RDW 46.9 62.9 - 52.8 %   Platelets 201 140 - 400 K/uL   MPV 10.4 7.5 - 12.5 fL   Neutro Abs 2,688 1,500 - 7,800 cells/uL   Lymphs Abs 966 850 - 3,900 cells/uL   Monocytes Absolute 504 200 - 950 cells/uL   Eosinophils Absolute 42 15 - 500 cells/uL   Basophils Absolute 0 0 - 200 cells/uL   Neutrophils Relative % 64 %   Lymphocytes Relative 23 %   Monocytes Relative 12 %   Eosinophils Relative 1 %   Basophils Relative 0 %   Smear Review Criteria for review not met   Comprehensive metabolic panel  Result Value Ref Range   Sodium 139 135 - 146 mmol/L   Potassium 3.8 3.5 - 5.3 mmol/L   Chloride 102 98 - 110 mmol/L   CO2 26 20 - 31 mmol/L   Glucose, Bld 93 65 - 99 mg/dL   BUN 20 7 - 25 mg/dL   Creat 4.13 2.44 - 0.10 mg/dL   Total Bilirubin 0.6 0.2 - 1.2 mg/dL   Alkaline Phosphatase 66 33 - 130 U/L   AST 28 10 - 35 U/L   ALT 17 6 - 29 U/L   Total Protein 6.8 6.1 - 8.1 g/dL   Albumin 4.3 3.6 - 5.1 g/dL    Calcium 9.5 8.6 - 27.2 mg/dL  Vitamin Z36  Result Value Ref Range   Vitamin B-12 403 200 - 1,100 pg/mL  VITAMIN D 25 Hydroxy (Vit-D Deficiency, Fractures)  Result Value Ref Range   Vit D, 25-Hydroxy 43 30 - 100 ng/mL  HIV antibody  Result Value Ref Range   HIV 1&2 Ab, 4th Generation NONREACTIVE NONREACTIVE  Hepatitis C antibody  Result Value Ref Range   HCV Ab NEGATIVE NEGATIVE  Lyme Ab/Western Blot Reflex  Result Value Ref Range   B burgdorferi Ab IgG+IgM <0.90 <0.90 Index       Assessment & Plan:   1. Dizziness   2. Benign paroxysmal positional vertigo due to bilateral vestibular disorder   3. Screening for HIV (human immunodeficiency virus)   4. Encounter for hepatitis C screening test for low risk patient   5. Need for prophylactic vaccination and inoculation against influenza    -persistent. -s/p extensive work up including labs, CT head, MRI brain, ENT consultation. -refer to neurology due to persistent dizziness. -refer to physical therapy. -obtain Lyme's disease titers per patient request.   Orders Placed This Encounter  Procedures  . Flu Vaccine QUAD 36+ mos IM  . B. burgdorfi antibodies  . CBC with Differential/Platelet  . Comprehensive metabolic panel  . Vitamin B12  . VITAMIN D 25 Hydroxy (Vit-D Deficiency, Fractures)  . HIV antibody  . Hepatitis C antibody  . Lyme Ab/Western Blot Reflex  . Ambulatory referral to Neurology    Referral Priority:   Routine    Referral Type:   Consultation    Referral Reason:   Specialty Services Required    Requested Specialty:   Neurology    Number of Visits Requested:   1  . Ambulatory referral to Physical Therapy    Referral Priority:   Routine    Referral Type:   Physical Medicine  Referral Reason:   Specialty Services Required    Requested Specialty:   Physical Therapy    Number of Visits Requested:   1  . Orthostatic vital signs   No orders of the defined types were placed in this encounter.   No  Follow-up on file.   Melvie Paglia Paulita Fujita, M.D. [facility] [address] [place], [place]  [address] [phone] phone [phone] fax

## 2015-10-25 LAB — VITAMIN D 25 HYDROXY (VIT D DEFICIENCY, FRACTURES): Vit D, 25-Hydroxy: 43 ng/mL (ref 30–100)

## 2015-10-25 LAB — LYME AB/WESTERN BLOT REFLEX: B burgdorferi Ab IgG+IgM: <0.9 Index (ref <0.90)

## 2015-11-21 ENCOUNTER — Ambulatory Visit: Payer: Federal, State, Local not specified - PPO | Attending: Family Medicine | Admitting: Physical Therapy

## 2015-11-21 DIAGNOSIS — H8111 Benign paroxysmal vertigo, right ear: Secondary | ICD-10-CM | POA: Diagnosis present

## 2015-11-21 DIAGNOSIS — R42 Dizziness and giddiness: Secondary | ICD-10-CM | POA: Insufficient documentation

## 2015-11-21 NOTE — Patient Instructions (Signed)
Self Treatment for Right Posterior / Anterior Canalithiasis    Sitting on bed: 1. Turn head 45 right. (a) Lie back slowly, shoulders on pillow, head on bed. (b) Hold _30___ seconds. 2. Keeping head on bed, turn head 90 left. Hold _15___ seconds. 3. Roll to left, head on 45 angle down toward bed. Hold ___15_ seconds. 4. Sit up on left side of bed. Repeat _3___ times per session. Do __1-2__ sessions per day.  Copyright  VHI. All rights reserved.

## 2015-11-21 NOTE — Therapy (Signed)
[facility] [address] [place], [place], [address] Phone: [phone]   Fax:  [phone]  Physical Therapy Evaluation  Patient Details  Name: Lisa Sloan MRN: [id number] Date of Birth: 06/30/1950 Referring Provider: Nilda Simmer, MD  Encounter Date: 11/21/2015      PT End of Session - 11/21/15 1044    Visit Number 1   Number of Visits 2   Authorization Type BCBS [employer]   PT Start Time [phone]   PT Stop Time 0930   PT Time Calculation (min) 44 min      Past Medical History:  Diagnosis Date  . Hypertension   . Osteoporosis     Past Surgical History:  Procedure Laterality Date  . CESAREAN SECTION    . FRACTURE SURGERY    . HALLUX VALGUS CORRECTION      There were no vitals filed for this visit.       Subjective Assessment - 11/21/15 1035    Subjective Pt reports dizziness is constant; started 08-12-15 with sudden onset vertigo with nausea/vomiting; diagnosed with BPPV; went to ENT - diagnosed with BPPV and made neurology referral: pt reports moderate vertigo at time of eval   Pertinent History tinnitus - started many years ago   Patient Stated Goals try to resolve the vertigo   Currently in Pain? No/denies            [facility] PT Assessment - 11/21/15 6606      Assessment   Medical Diagnosis BPPV   Referring Provider Nilda Simmer, MD   Onset Date/Surgical Date 08/12/15   Prior Therapy none     Balance Screen   Has the patient fallen in the past 6 months No   Has the patient had a decrease in activity level because of a fear of falling?  No   Is the patient reluctant to leave their home because of a fear of falling?  No            Vestibular Assessment - 11/21/15 0906      Vestibular Assessment   General Observation Pt is a 65 year old lady with c/o sudden onset of vertigo on 08-12-15 that started while she was reading emails:  pt reports vertigo is mostly constant in occurrence but states  it varies in intensity     Symptom Behavior   Type of Dizziness Oscillopsia  objects bounce when she moves   Frequency of Dizziness varies   Duration of Dizziness constant   Aggravating Factors Activity in general   Relieving Factors Head stationary     Occulomotor Exam   Occulomotor Alignment Normal   Smooth Pursuits Intact   Saccades Intact     Positional Testing   Dix-Hallpike Dix-Hallpike Right;Dix-Hallpike Left   Sidelying Test Sidelying Right;Sidelying Left     Dix-Hallpike Right   Dix-Hallpike Right Duration approx. 20 secs   Dix-Hallpike Right Symptoms Upbeat, right rotatory nystagmus     Dix-Hallpike Left   Dix-Hallpike Left Duration none   Dix-Hallpike Left Symptoms No nystagmus     Sidelying Right   Sidelying Right Duration none   Sidelying Right Symptoms No nystagmus     Sidelying Left   Sidelying Left Duration none   Sidelying Left Symptoms No nystagmus      Performed Epley maneuver x 2 reps for R BPPV - no nystagmus observed on 2nd rep but pt did continue to report a visual Disturbance of specific spot on wall moving (in initial position  of maneuver):  Symptoms appeared to be improved overall on 2nd rep compared to 1st rep of Epley's  Pt requests to not schedule follow up appt at this time but wants to wait and see how she feels and determine if dizziness improves/resolves               PT Education - 11/21/15 1044    Education provided Yes   Education Details bppv information and Epley    Person(s) Educated Patient   Methods Explanation;Demonstration;Handout   Comprehension Verbalized understanding;Returned demonstration             PT Long Term Goals - 11/21/15 1049      PT LONG TERM GOAL #1   Title Independent in HEP for habituation of R BPPV.  12-21-15   Time 4   Period Weeks   Status New     PT LONG TERM GOAL #2   Title Perform SOT and establish LTG as appropriate.  12-21-15   Time 4   Period Weeks   Status New                Plan - 11/21/15 1045    Clinical Impression Statement Pt presents with signs and symptoms consistent with R posterior canalithiasis with R upbeating rotary nystagmus noted on 1 occurrence in R Dix-Hallpike position; performed 2 reps of Epley manuever for R BPPV and no nystagmus noted but pt reported visual disturbance with "spot on the wall moving"; some slight improvement with this disturbance noted on 2nd rep of Epley   Rehab Potential Good   PT Frequency 1x / week   PT Duration 2 weeks   PT Treatment/Interventions ADLs/Self Care Home Management;Canalith Repostioning;Vestibular;Therapeutic activities;Therapeutic exercise;Balance training;Neuromuscular re-education;Patient/family education;Gait training   PT Next Visit Plan reassess vertigo; Epley for R BPPV prn:  SOT   PT Home Exercise Plan Epley for R BPPV prn   Recommended Other Services pt has neurology referral   Consulted and Agree with Plan of Care Patient      Patient will benefit from skilled therapeutic intervention in order to improve the following deficits and impairments:  Dizziness  Visit Diagnosis: BPPV (benign paroxysmal positional vertigo), right - Plan: PT plan of care cert/re-cert  Dizziness and giddiness - Plan: PT plan of care cert/re-cert     Problem List Patient Active Problem List   Diagnosis Date Noted  . Osteoporosis 08/04/2015  . Eczema 08/04/2015  . Essential hypertension 01/09/2014    Kary Kos, PT 11/21/2015, 10:55 AM  [facility] [address] [place], [place], [address] Phone: [phone]   Fax:  [phone]  Name: Lisa Sloan MRN: [id number] Date of Birth: Feb 13, 1950

## 2015-11-28 ENCOUNTER — Other Ambulatory Visit: Payer: Self-pay | Admitting: Family Medicine

## 2015-12-03 ENCOUNTER — Telehealth: Payer: Self-pay

## 2015-12-03 NOTE — Telephone Encounter (Signed)
error 

## 2015-12-19 ENCOUNTER — Other Ambulatory Visit: Payer: Self-pay | Admitting: Family Medicine

## 2016-01-28 ENCOUNTER — Ambulatory Visit (INDEPENDENT_AMBULATORY_CARE_PROVIDER_SITE_OTHER): Payer: Federal, State, Local not specified - PPO

## 2016-01-28 ENCOUNTER — Ambulatory Visit (INDEPENDENT_AMBULATORY_CARE_PROVIDER_SITE_OTHER): Payer: Federal, State, Local not specified - PPO | Admitting: Physician Assistant

## 2016-01-28 VITALS — BP 104/78 | HR 94 | Temp 97.2°F | Resp 16 | Ht 65.0 in | Wt 138.0 lb

## 2016-01-28 DIAGNOSIS — M899 Disorder of bone, unspecified: Secondary | ICD-10-CM

## 2016-01-28 DIAGNOSIS — F430 Acute stress reaction: Secondary | ICD-10-CM

## 2016-01-28 DIAGNOSIS — Z8739 Personal history of other diseases of the musculoskeletal system and connective tissue: Secondary | ICD-10-CM

## 2016-01-28 DIAGNOSIS — M25552 Pain in left hip: Secondary | ICD-10-CM

## 2016-01-28 LAB — POCT CBC
Granulocyte percent: 74.2 %G (ref 37–80)
HCT, POC: 42.9 % (ref 37.7–47.9)
Hemoglobin: 15 g/dL (ref 12.2–16.2)
Lymph, poc: 1.1 (ref 0.6–3.4)
MCH: 30.9 pg (ref 27–31.2)
MCHC: 34.9 g/dL (ref 31.8–35.4)
MCV: 88.5 fL (ref 80–97)
MID (CBC): 0.4 (ref 0–0.9)
MPV: 8.6 fL (ref 0–99.8)
PLATELET COUNT, POC: 174 10*3/uL (ref 142–424)
POC Granulocyte: 4.3 (ref 2–6.9)
POC LYMPH %: 18.4 % (ref 10–50)
POC MID %: 7.4 %M (ref 0–12)
RBC: 4.85 M/uL (ref 4.04–5.48)
RDW, POC: 13.3 %
WBC: 5.8 10*3/uL (ref 4.6–10.2)

## 2016-01-28 LAB — POCT URINALYSIS DIP (MANUAL ENTRY)
BILIRUBIN UA: NEGATIVE
Bilirubin, UA: NEGATIVE
Glucose, UA: NEGATIVE
Nitrite, UA: NEGATIVE
PH UA: 6.5
PROTEIN UA: NEGATIVE
SPEC GRAV UA: 1.02
Urobilinogen, UA: 0.2

## 2016-01-28 LAB — POCT SEDIMENTATION RATE: POCT SED RATE: 13 mm/h (ref 0–22)

## 2016-01-28 MED ORDER — TRAMADOL HCL 50 MG PO TABS: 25.0000 mg | ORAL_TABLET | Freq: Three times a day (TID) | ORAL | 0 refills | Status: DC | PRN

## 2016-01-28 MED ORDER — BUSPIRONE HCL 5 MG PO TABS: 5.0000 mg | ORAL_TABLET | Freq: Three times a day (TID) | ORAL | 0 refills | Status: DC

## 2016-01-28 NOTE — Patient Instructions (Addendum)
  GO TO [facility] 830AM 01/29/16 NO SOLIDS TO EAT, BUT CAN HAVE LIQUIDS AND MEDICINES [address] LOCATION   Dg Chest 2 View  Result Date: 01/28/2016 CLINICAL DATA:  Lytic lesions bilateral iliac bones EXAM: CHEST  2 VIEW COMPARISON:  01/28/2016 FINDINGS: Cardiomediastinal silhouette is unremarkable. No infiltrate or pleural effusion. No pulmonary edema. Osteopenia and degenerative changes mid and lower thoracic spine. IMPRESSION: No active cardiopulmonary disease. Osteopenia and degenerative changes thoracic spine. Electronically Signed   By: Natasha MeadLiviu  Pop M.D.   On: 01/28/2016 16:19   Dg Abd 2 Views  Result Date: 01/28/2016 CLINICAL DATA:  Lytic bone lesions EXAM: ABDOMEN - 2 VIEW COMPARISON:  Pelvis image earlier in the day FINDINGS: Supine and upright images obtained. There are lucent areas in each iliac crest, concerning for potential metastatic foci. Note that currently stool overlies portions of each iliac crest. There is stool throughout the colon. No bowel dilatation or air-fluid level is evident. No free air. Lung bases clear. IMPRESSION: Scattered small lytic appearing bone lesions present in the iliac crests. Bowel gas pattern normal. No bowel obstruction or free air. Visualized lung bases clear. Electronically Signed   By: Bretta BangWilliam  Woodruff III M.D.   On: 01/28/2016 16:33   Dg Hip Unilat W Or W/o Pelvis 2-3 Views Left  Result Date: 01/28/2016 CLINICAL DATA:  Pain EXAM: DG HIP (WITH OR WITHOUT PELVIS) 2-3V LEFT COMPARISON:  None. FINDINGS: Frontal pelvis as well as frontal and lateral left hip images were obtained. There is no acute fracture or dislocation. Hip joints appear symmetric and unremarkable. There is bony overgrowth along the medial upper right sacrum. No erosive change. There are several lytic appearing lesions in the iliac crests, largest on the left superiorly measuring 2.1 x 1.6 cm. IMPRESSION: Several lytic appearing lesions are noted in  each iliac bone. This appearance is concerning for potential underlying metastatic disease. This finding may warrant nuclear medicine bone scan and to assess for extent of bony disease. No fracture or dislocation. No appreciable hip joint narrowing or erosion. These results will be called to the ordering clinician or representative by the Radiologist Assistant, and communication documented in the PACS or zVision Dashboard. Electronically Signed   By: Bretta BangWilliam  Woodruff III M.D.   On: 01/28/2016 15:37     IF you received an x-ray today, you will receive an invoice from [facility]. Please contact [facility] at [phone] with questions or concerns regarding your invoice.   IF you received labwork today, you will receive an invoice from [facility]. Please contact [facility] at [phone] with questions or concerns regarding your invoice.   Our billing staff will not be able to assist you with questions regarding bills from these companies.  You will be contacted with the lab results as soon as they are available. The fastest way to get your results is to activate your My Chart account. Instructions are located on the last page of this paperwork. If you have not heard from us regarding the results in 2 weeks, please contact this office.

## 2016-01-28 NOTE — Progress Notes (Signed)
I tried to PA ct scans and bcbs closed I l/m to call asap

## 2016-01-28 NOTE — Progress Notes (Signed)
01/29/2016 3:32 PM   DOB: [birth year] / MRN: [id number]  SUBJECTIVE:  Lisa Sloan is a 66 y.o. female presenting for left hip pain that started 4 days ago and is worsening.  The pain is worse with with walking, particularly with hip extension.  The pain is located in the anterior and lateral aspect of the hip.  Reports the pain only occurs with activity and take about 15 minutes to resolve with rest.  The pain is sharp.  She has tried ASA only for the pain. She has a PMH of osteoporosis. She states the pain radiates to the knee. She denies pertinent rash, however did use the heating pad too long last night and has a very mild burn about the anterior hip.   PAP negative on Dec. Of 2015. Last mammogram document in [facility] is 08/14/2009.  She hs never had any colon screenings.    Immunization History  Administered Date(s) Administered  . Influenza,inj,Quad PF,36+ Mos 11/20/2013, 01/09/2015, 10/24/2015  . Tdap 01/09/2015     She has No Known Allergies.   She  has a past medical history of Hypertension and Osteoporosis.    She  reports that she has quit smoking. Her smoking use included Cigarettes. She has a 5.00 pack-year smoking history. She has never used smokeless tobacco. She reports that she drinks about 6.0 oz of alcohol per week . She reports that she does not use drugs. She  has no sexual activity history on file. The patient  has a past surgical history that includes Cesarean section; Fracture surgery; and Hallux valgus correction.  Her family history includes Cancer (age of onset: 66) in her father; Dementia in her father; Diabetes in her maternal grandmother and mother; Heart disease in her father and mother; Hypertension in her father; Kidney failure in her father.  Review of Systems  Constitutional: Negative for chills and fever.  Musculoskeletal: Positive for joint pain and myalgias. Negative for back pain.  Skin: Negative for itching and rash.  Neurological: Negative for  dizziness.    The problem list and medications were reviewed and updated by myself where necessary and exist elsewhere in the encounter.   OBJECTIVE:  BP 104/78   Pulse 94   Temp 97.2 F (36.2 C) (Oral)   Resp 16   Ht _0  (1.651 m)   Wt 138 lb (62.6 kg)   SpO2 98%   BMI 22.96 kg/m   Physical Exam  Constitutional: She is oriented to person, place, and time.  Cardiovascular: Normal rate.   Pulmonary/Chest: Effort normal and breath sounds normal. Right breast exhibits no inverted nipple, no mass, no nipple discharge, no skin change and no tenderness. Left breast exhibits no inverted nipple, no mass, no nipple discharge, no skin change and no tenderness. Breasts are symmetrical.  Musculoskeletal: Normal range of motion.       Left hip: She exhibits normal range of motion, normal strength, no tenderness, no bony tenderness, no swelling, no crepitus, no deformity and no laceration.       Legs: Neurological: She is alert and oriented to person, place, and time.  Skin: Rash (about the anterior hip that appears to be a mild burn) noted. No erythema. No pallor.  Psychiatric: She has a normal mood and affect.   Wt Readings from Last 3 Encounters:  01/28/16 138 lb (62.6 kg)  10/24/15 136 lb (61.7 kg)  08/14/15 134 lb (60.8 kg)   Pulse Readings from Last 3 Encounters:  01/28/16 94  10/24/15  83  08/14/15 94    Lab Results  Component Value Date   CREATININE 0.86 01/28/2016   Results for orders placed or performed in visit on 01/28/16 (from the past 72 hour(s))  CMP14+EGFR     Status: None   Collection Time: 01/28/16  4:01 PM  Result Value Ref Range   Glucose 98 65 - 99 mg/dL   BUN 17 8 - 27 mg/dL   Creatinine, Ser 0.86 0.57 - 1.00 mg/dL   GFR calc non Af Amer 72 >59 mL/min/1.73   GFR calc Af Amer 83 >59 mL/min/1.73   BUN/Creatinine Ratio 20 12 - 28   Sodium 141 134 - 144 mmol/L   Potassium 4.7 3.5 - 5.2 mmol/L   Chloride 102 96 - 106 mmol/L   CO2 23 18 - 29 mmol/L    Calcium 9.8 8.7 - 10.3 mg/dL   Total Protein 7.2 6.0 - 8.5 g/dL   Albumin 4.5 3.6 - 4.8 g/dL   Globulin, Total 2.7 1.5 - 4.5 g/dL   Albumin/Globulin Ratio 1.7 1.2 - 2.2   Bilirubin Total 0.3 0.0 - 1.2 mg/dL   Alkaline Phosphatase 71 39 - 117 IU/L   AST 19 0 - 40 IU/L   ALT 17 0 - 32 IU/L  Lipase     Status: None   Collection Time: 01/28/16  4:01 PM  Result Value Ref Range   Lipase 41 14 - 72 U/L  POCT CBC     Status: None   Collection Time: 01/28/16  4:12 PM  Result Value Ref Range   WBC 5.8 4.6 - 10.2 K/uL   Lymph, poc 1.1 0.6 - 3.4   POC LYMPH PERCENT 18.4 10 - 50 %L   MID (cbc) 0.4 0 - 0.9   POC MID % 7.4 0 - 12 %M   POC Granulocyte 4.3 2 - 6.9   Granulocyte percent 74.2 37 - 80 %G   RBC 4.85 4.04 - 5.48 M/uL   Hemoglobin 15.0 12.2 - 16.2 g/dL   HCT, POC 42.9 37.7 - 47.9 %   MCV 88.5 80 - 97 fL   MCH, POC 30.9 27 - 31.2 pg   MCHC 34.9 31.8 - 35.4 g/dL   RDW, POC 13.3 %   Platelet Count, POC 174 142 - 424 K/uL   MPV 8.6 0 - 99.8 fL  POCT urinalysis dipstick     Status: Abnormal   Collection Time: 01/28/16  4:38 PM  Result Value Ref Range   Color, UA yellow yellow   Clarity, UA clear clear   Glucose, UA negative negative   Bilirubin, UA negative negative   Ketones, POC UA negative negative   Spec Grav, UA 1.020    Blood, UA trace-intact (A) negative   pH, UA 6.5    Protein Ur, POC negative negative   Urobilinogen, UA 0.2    Nitrite, UA Negative Negative   Leukocytes, UA small (1+) (A) Negative  POCT SEDIMENTATION RATE     Status: None   Collection Time: 01/28/16  6:34 PM  Result Value Ref Range   POCT SED RATE 13 0 - 22 mm/hr    Dg Chest 2 View  Result Date: 01/28/2016 CLINICAL DATA:  Lytic lesions bilateral iliac bones EXAM: CHEST  2 VIEW COMPARISON:  01/28/2016 FINDINGS: Cardiomediastinal silhouette is unremarkable. No infiltrate or pleural effusion. No pulmonary edema. Osteopenia and degenerative changes mid and lower thoracic spine. IMPRESSION: No active  cardiopulmonary disease. Osteopenia and degenerative changes thoracic spine. Electronically Signed  By: Lahoma Crocker M.D.   On: 01/28/2016 16:19   Ct Soft Tissue Neck W Contrast  Result Date: 01/29/2016 CLINICAL DATA:  Lytic lesion of bone on x-ray. EXAM: CT NECK WITH CONTRAST TECHNIQUE: Multidetector CT imaging of the neck was performed using the standard protocol following the bolus administration of intravenous contrast. CONTRAST:  148m ISOVUE-300 IOPAMIDOL (ISOVUE-300) INJECTION 61% COMPARISON:  None. FINDINGS: Pharynx and larynx: The nasopharynx was incompletely covered. No mass or swelling noted throughout the pharynx or larynx. Portions of the oral cavity are obscured by streak artifact. Salivary glands: Negative Thyroid: Negative Lymph nodes: None enlarged or abnormal density. Vascular: Mild atherosclerotic calcification. Limited intracranial: Minimally seen.  No findings. Visualized orbits: Not visualized Mastoids and visualized paranasal sinuses: Minimally seen, clear. Skeleton: No acute or aggressive finding. Upper chest: Reported separately IMPRESSION: 1. Negative neck CT.  No evidence of malignancy. 2. Chest and abdomen CT reported separately. 3. The upper nasopharynx was not included in the field-of-view. If this area is deemed clinically important, I can arrange for repeat examination. Electronically Signed   By: JMonte FantasiaM.D.   On: 01/29/2016 09:53   Ct Chest W Contrast  Result Date: 01/29/2016 CLINICAL DATA:  Lytic lesions on plain film involving the pelvis. No history of primary malignancy. Left hip pain for 2 days. EXAM: CT CHEST, ABDOMEN, AND PELVIS WITH CONTRAST TECHNIQUE: Multidetector CT imaging of the chest, abdomen and pelvis was performed following the standard protocol during bolus administration of intravenous contrast. CONTRAST:  1071mISOVUE-300 IOPAMIDOL (ISOVUE-300) INJECTION 61% COMPARISON:  None. FINDINGS: CT CHEST FINDINGS Cardiovascular: Aortic atherosclerosis.  Normal heart size, without pericardial effusion. Lad coronary artery atherosclerosis. Mediastinum/Nodes: No mediastinal or hilar adenopathy. Lungs/Pleura: No pleural fluid. 3 mm right middle lobe subpleural pulmonary nodule on 65/series 9. A 2 mm subpleural right lower lobe nodule on image 94/ series 9. Musculoskeletal: No acute osseous abnormality. CT ABDOMEN PELVIS FINDINGS Hepatobiliary: Normal liver. Normal gallbladder. No intrahepatic biliary duct dilatation. The common duct is mildly prominent for patient age at 9 mm in the porta hepatis on image 43/ series 601. Upper normal in this age group 7-8 mm. Gradual tapering in the region of the pancreatic head, without obstructive stone or mass. Pancreas: Normal, without mass or ductal dilatation. Spleen: Normal in size, without focal abnormality. Adrenals/Urinary Tract: Normal adrenal glands. Normal kidneys, without hydronephrosis. Normal urinary bladder. Stomach/Bowel: Normal stomach, without wall thickening. Normal colon, appendix, and terminal ileum. Normal small bowel. Vascular/Lymphatic: Aortic and branch vessel atherosclerosis. No abdominopelvic adenopathy. Reproductive: Hysterectomy.  No adnexal mass. Other: No significant free fluid. Musculoskeletal: No focal osseous lesion. No correlate for the plain film abnormality, which likely was secondary to overlying bowel gas. Degenerate disc disease at the lumbosacral junction with L4-5 disc bulge. IMPRESSION: 1. No acute process in the chest, abdomen, or pelvis. No evidence of primary malignancy or metastatic disease. The appearance of lytic lesions within the pelvis on plain film was likely artifactual due to overlying bowel gas. 2.  Coronary artery atherosclerosis. Aortic atherosclerosis. 3. Pulmonary nodules, maximally 3 mm. No follow-up needed if patient is low-risk. Non-contrast chest CT can be considered in 12 months if patient is high-risk. This recommendation follows the consensus statement: Guidelines for  Management of Incidental Pulmonary Nodules Detected on CT Images: From the Fleischner Society 2017; Radiology 2017; 284:228-243. 4. Minimal common duct dilatation. Most likely within normal variation in this age group. Consider correlation with bilirubin levels. If these are elevated, MRCP or ERCP should be considered.  Electronically Signed   By: Abigail Miyamoto M.D.   On: 01/29/2016 09:46   Ct Abdomen Pelvis W Contrast  Result Date: 01/29/2016 CLINICAL DATA:  Lytic lesions on plain film involving the pelvis. No history of primary malignancy. Left hip pain for 2 days. EXAM: CT CHEST, ABDOMEN, AND PELVIS WITH CONTRAST TECHNIQUE: Multidetector CT imaging of the chest, abdomen and pelvis was performed following the standard protocol during bolus administration of intravenous contrast. CONTRAST:  169m ISOVUE-300 IOPAMIDOL (ISOVUE-300) INJECTION 61% COMPARISON:  None. FINDINGS: CT CHEST FINDINGS Cardiovascular: Aortic atherosclerosis. Normal heart size, without pericardial effusion. Lad coronary artery atherosclerosis. Mediastinum/Nodes: No mediastinal or hilar adenopathy. Lungs/Pleura: No pleural fluid. 3 mm right middle lobe subpleural pulmonary nodule on 65/series 9. A 2 mm subpleural right lower lobe nodule on image 94/ series 9. Musculoskeletal: No acute osseous abnormality. CT ABDOMEN PELVIS FINDINGS Hepatobiliary: Normal liver. Normal gallbladder. No intrahepatic biliary duct dilatation. The common duct is mildly prominent for patient age at 9 mm in the porta hepatis on image 43/ series 601. Upper normal in this age group 7-8 mm. Gradual tapering in the region of the pancreatic head, without obstructive stone or mass. Pancreas: Normal, without mass or ductal dilatation. Spleen: Normal in size, without focal abnormality. Adrenals/Urinary Tract: Normal adrenal glands. Normal kidneys, without hydronephrosis. Normal urinary bladder. Stomach/Bowel: Normal stomach, without wall thickening. Normal colon, appendix, and  terminal ileum. Normal small bowel. Vascular/Lymphatic: Aortic and branch vessel atherosclerosis. No abdominopelvic adenopathy. Reproductive: Hysterectomy.  No adnexal mass. Other: No significant free fluid. Musculoskeletal: No focal osseous lesion. No correlate for the plain film abnormality, which likely was secondary to overlying bowel gas. Degenerate disc disease at the lumbosacral junction with L4-5 disc bulge. IMPRESSION: 1. No acute process in the chest, abdomen, or pelvis. No evidence of primary malignancy or metastatic disease. The appearance of lytic lesions within the pelvis on plain film was likely artifactual due to overlying bowel gas. 2.  Coronary artery atherosclerosis. Aortic atherosclerosis. 3. Pulmonary nodules, maximally 3 mm. No follow-up needed if patient is low-risk. Non-contrast chest CT can be considered in 12 months if patient is high-risk. This recommendation follows the consensus statement: Guidelines for Management of Incidental Pulmonary Nodules Detected on CT Images: From the Fleischner Society 2017; Radiology 2017; 284:228-243. 4. Minimal common duct dilatation. Most likely within normal variation in this age group. Consider correlation with bilirubin levels. If these are elevated, MRCP or ERCP should be considered. Electronically Signed   By: KAbigail MiyamotoM.D.   On: 01/29/2016 09:46   Dg Abd 2 Views  Result Date: 01/28/2016 CLINICAL DATA:  Lytic bone lesions EXAM: ABDOMEN - 2 VIEW COMPARISON:  Pelvis image earlier in the day FINDINGS: Supine and upright images obtained. There are lucent areas in each iliac crest, concerning for potential metastatic foci. Note that currently stool overlies portions of each iliac crest. There is stool throughout the colon. No bowel dilatation or air-fluid level is evident. No free air. Lung bases clear. IMPRESSION: Scattered small lytic appearing bone lesions present in the iliac crests. Bowel gas pattern normal. No bowel obstruction or free air.  Visualized lung bases clear. Electronically Signed   By: WLowella GripIII M.D.   On: 01/28/2016 16:33   Lab Results  Component Value Date   TSH 1.59 07/10/2015     ASSESSMENT AND PLAN:  CAleighnawas seen today for thigh pain.  Diagnoses and all orders for this visit:  Left hip pain: Likely muscular and no evidence of malignancy  on rads.  Advised tramadol and will consider flexril as needed.   -     DG HIP UNILAT W OR W/O PELVIS 2-3 VIEWS LEFT; Future  Lytic lesion of bone on x-ray: See CT scan where radiology is calling previous lytic lesions on radiographs artifact.  No evidence of bony mets at this time. Patient made aware.  See problem one. Patient made aware. Will proceed with bone scan.  -     DG Chest 2 View; Future -     DG Abd 2 Views; Future -     POCT urinalysis dipstick -     POCT CBC -     CMP14+EGFR -     Lipase -     CT Chest W Contrast; Future -     CT Abdomen Pelvis W Contrast; Future -     CT SOFT TISSUE NECK W CONTRAST; Future -     NM Bone Scan Whole Body; Future -     POCT SEDIMENTATION RATE  Acute reaction to stress: Buspar as needed.  -     busPIRone (BUSPAR) 5 MG tablet; Take 1 tablet (5 mg total) by mouth 3 (three) times daily.    The patient is advised to call or return to clinic if she does not see an improvement in symptoms, or to seek the care of the closest emergency department if she worsens with the above plan.   Philis Fendt, MHS, PA-C [facility] 01/29/2016 3:32 PM

## 2016-01-29 ENCOUNTER — Telehealth: Payer: Self-pay

## 2016-01-29 ENCOUNTER — Ambulatory Visit: Payer: Federal, State, Local not specified - PPO

## 2016-01-29 ENCOUNTER — Ambulatory Visit
Admission: RE | Admit: 2016-01-29 | Discharge: 2016-01-29 | Disposition: A | Discharge status: Home / Self Care | Payer: Federal, State, Local not specified - PPO | Source: Ambulatory Visit | Attending: Physician Assistant | Admitting: Physician Assistant

## 2016-01-29 DIAGNOSIS — M899 Disorder of bone, unspecified: Secondary | ICD-10-CM

## 2016-01-29 LAB — CMP14+EGFR
A/G RATIO: 1.7 (ref 1.2–2.2)
ALBUMIN: 4.5 g/dL (ref 3.6–4.8)
ALT: 17 IU/L (ref 0–32)
AST: 19 IU/L (ref 0–40)
Alkaline Phosphatase: 71 IU/L (ref 39–117)
BUN / CREAT RATIO: 20 (ref 12–28)
BUN: 17 mg/dL (ref 8–27)
Bilirubin Total: 0.3 mg/dL (ref 0.0–1.2)
CALCIUM: 9.8 mg/dL (ref 8.7–10.3)
CO2: 23 mmol/L (ref 18–29)
Chloride: 102 mmol/L (ref 96–106)
Creatinine, Ser: 0.86 mg/dL (ref 0.57–1.00)
GFR, EST AFRICAN AMERICAN: 83 mL/min/{1.73_m2} (ref >59)
GFR, EST NON AFRICAN AMERICAN: 72 mL/min/{1.73_m2} (ref >59)
GLOBULIN, TOTAL: 2.7 g/dL (ref 1.5–4.5)
Glucose: 98 mg/dL (ref 65–99)
POTASSIUM: 4.7 mmol/L (ref 3.5–5.2)
SODIUM: 141 mmol/L (ref 134–144)
TOTAL PROTEIN: 7.2 g/dL (ref 6.0–8.5)

## 2016-01-29 LAB — LIPASE: LIPASE: 41 U/L (ref 14–72)

## 2016-01-29 MED ORDER — IOPAMIDOL (ISOVUE-300) INJECTION 61%
100.0000 mL | Freq: Once | INTRAVENOUS | Status: AC | PRN
  Administered 2016-01-29: 100 mL via INTRAVENOUS

## 2016-01-29 NOTE — Telephone Encounter (Signed)
Called pt to inform of NM Full Body Scan. [facility] Arrive at 11am for injection and procedure will be at 2pm. Pt understood.   No PA needed for STAT CT scans on 1/23

## 2016-02-03 ENCOUNTER — Encounter (HOSPITAL_COMMUNITY)
Admission: RE | Admit: 2016-02-03 | Discharge: 2016-02-03 | Disposition: A | Discharge status: Home / Self Care | Payer: Federal, State, Local not specified - PPO | Source: Ambulatory Visit | Attending: Physician Assistant | Admitting: Physician Assistant

## 2016-02-03 DIAGNOSIS — M899 Disorder of bone, unspecified: Secondary | ICD-10-CM | POA: Insufficient documentation

## 2016-02-03 DIAGNOSIS — M25552 Pain in left hip: Secondary | ICD-10-CM | POA: Insufficient documentation

## 2016-02-03 DIAGNOSIS — R937 Abnormal findings on diagnostic imaging of other parts of musculoskeletal system: Secondary | ICD-10-CM | POA: Insufficient documentation

## 2016-02-03 MED ORDER — TECHNETIUM TC 99M MEDRONATE IV KIT
22.5000 | PACK | Freq: Once | INTRAVENOUS | Status: AC | PRN
  Administered 2016-02-03: 22.5 via INTRAVENOUS

## 2016-02-03 NOTE — Progress Notes (Signed)
Please mail a letter.  LMOM regarding benign results.

## 2016-02-11 ENCOUNTER — Other Ambulatory Visit: Payer: Self-pay | Admitting: Family Medicine

## 2016-02-11 ENCOUNTER — Encounter: Payer: Self-pay | Admitting: Radiology

## 2016-02-19 ENCOUNTER — Ambulatory Visit: Payer: Federal, State, Local not specified - PPO | Admitting: Family Medicine

## 2016-02-25 ENCOUNTER — Encounter: Payer: Self-pay | Admitting: Family Medicine

## 2016-03-02 ENCOUNTER — Ambulatory Visit: Payer: Federal, State, Local not specified - PPO | Admitting: Physician Assistant

## 2016-03-13 ENCOUNTER — Other Ambulatory Visit: Payer: Self-pay | Admitting: Physician Assistant

## 2016-04-11 ENCOUNTER — Other Ambulatory Visit: Payer: Self-pay | Admitting: Family Medicine

## 2016-04-13 ENCOUNTER — Other Ambulatory Visit: Payer: Self-pay | Admitting: Family Medicine

## 2016-05-12 ENCOUNTER — Other Ambulatory Visit: Payer: Self-pay | Admitting: Family Medicine

## 2016-06-13 ENCOUNTER — Other Ambulatory Visit: Payer: Self-pay | Admitting: Family Medicine

## 2016-06-13 NOTE — Telephone Encounter (Signed)
Pt will need office visit for further refills

## 2016-07-11 ENCOUNTER — Other Ambulatory Visit: Payer: Self-pay | Admitting: Family Medicine

## 2016-08-14 ENCOUNTER — Other Ambulatory Visit: Payer: Self-pay | Admitting: Family Medicine

## 2016-08-18 ENCOUNTER — Telehealth: Payer: Self-pay | Admitting: Family Medicine

## 2016-08-18 NOTE — Telephone Encounter (Signed)
LMOM FOR PT TO CALL AND SCHEDULE AN OV FOR MED REFILL ON HYDRODIURIL AND LABS

## 2016-08-18 NOTE — Telephone Encounter (Signed)
LMOM FOR PT TO CALL AND SCHEDULE AN OV FOR MED REFILL ON HYDRODIURIL AND LABS 

## 2016-09-13 ENCOUNTER — Other Ambulatory Visit: Payer: Self-pay | Admitting: Family Medicine

## 2016-09-15 ENCOUNTER — Encounter: Payer: Self-pay | Admitting: Adult Health

## 2016-09-15 ENCOUNTER — Ambulatory Visit (INDEPENDENT_AMBULATORY_CARE_PROVIDER_SITE_OTHER): Payer: Medicare Other | Admitting: Adult Health

## 2016-09-15 VITALS — BP 153/84 | HR 80 | Ht 65.0 in | Wt 136.7 lb

## 2016-09-15 DIAGNOSIS — M810 Age-related osteoporosis without current pathological fracture: Secondary | ICD-10-CM

## 2016-09-15 DIAGNOSIS — Z0000 Encounter for general adult medical examination without abnormal findings: Secondary | ICD-10-CM | POA: Diagnosis not present

## 2016-09-15 DIAGNOSIS — I10 Essential (primary) hypertension: Secondary | ICD-10-CM | POA: Diagnosis not present

## 2016-09-15 DIAGNOSIS — Z1231 Encounter for screening mammogram for malignant neoplasm of breast: Secondary | ICD-10-CM | POA: Diagnosis not present

## 2016-09-15 DIAGNOSIS — Z1239 Encounter for other screening for malignant neoplasm of breast: Secondary | ICD-10-CM

## 2016-09-15 MED ORDER — ENALAPRIL MALEATE 2.5 MG PO TABS: 2.5000 mg | ORAL_TABLET | Freq: Every day | ORAL | 2 refills | Status: DC

## 2016-09-15 MED ORDER — HYDROCHLOROTHIAZIDE 25 MG PO TABS: 25.0000 mg | ORAL_TABLET | Freq: Every day | ORAL | 2 refills | Status: DC

## 2016-09-15 NOTE — Assessment & Plan Note (Addendum)
BP above goal 153/84-she reports taking both antihypertensives this morning.  Continue Enalapril 2.5mg  and HCTZ 25 mg daily.

## 2016-09-15 NOTE — Assessment & Plan Note (Signed)
Prolia  injections every 6 months. She has DEXA scan scheduled for this month.

## 2016-09-15 NOTE — Progress Notes (Signed)
Subjective:    Patient ID: Lisa Sloan, female    DOB: 1950-02-18, 66 y.o.   MRN: [id number]  HPI:  Lisa Sloan presents to establish as a new pt.  She is a very pleasant 66 year old female.  PMH:  HTN, osteoporosis, and eczema.  [facility] has been treating her osteoporosis with Prolia injections every 6 months and she has scheduled DEXA scan this month.  She denies falls and walks >43mles/day.  Her diet consists primarily of vegetables and lean protein.  She denies tobacco use and enjoys a glass of wine nightly.  Patient Care Team    Relationship Specialty Notifications Start End  SWardell Honour MD PCP - General Family Medicine  08/04/15     Patient Active Problem List   Diagnosis Date Noted  . Osteoporosis 08/04/2015  . Eczema 08/04/2015  . Essential hypertension 01/09/2014     Past Medical History:  Diagnosis Date  . Hypertension   . Orthostatic dizziness   . Osteoporosis      Past Surgical History:  Procedure Laterality Date  . CESAREAN SECTION    . FRACTURE SURGERY     ankle and wrist  . HALLUX VALGUS CORRECTION       Family History  Problem Relation Age of Onset  . Diabetes Mother   . Heart disease Mother        diabetic induced  . Hypertension Father   . Cancer Father 943      colon and skin  . Dementia Father   . Heart disease Father   . Kidney failure Father   . Diabetes Maternal Grandmother      History  Drug Use No     History  Alcohol Use  . 14.4 oz/week  . 10 Standard drinks or equivalent, 14 Glasses of wine per week    Comment: wine     History  Smoking Status  . Former Smoker  . Packs/day: 1.00  . Years: 5.00  . Types: Cigarettes  . Quit date: 01/06/1983  Smokeless Tobacco  . Never Used     Outpatient Encounter Prescriptions as of 09/15/2016  Medication Sig  . aspirin 325 MG EC tablet Take 325 mg by mouth daily as needed for pain.  .Marland Kitchendenosumab (PROLIA) 60 MG/ML SOLN injection Inject 60 mg into the skin every 6  (six) months. Administer in upper arm, thigh, or abdomen  . desonide (DESOWEN) 0.05 % cream Apply topically 2 (two) times daily. For maximum 10 days. (Patient taking differently: Apply 1 application topically 2 (two) times daily as needed (eczema.). For maximum 10 days.)  . enalapril (VASOTEC) 2.5 MG tablet TAKE 1 TABLET BY MOUTH EVERY DAY  . hydrochlorothiazide (HYDRODIURIL) 25 MG tablet TAKE 1 TABLET (25 MG TOTAL) BY MOUTH DAILY. NEEDS APPT 08/2016  . Multiple Vitamin (MULTIVITAMIN WITH MINERALS) TABS tablet Take 1 tablet by mouth daily.  . [DISCONTINUED] busPIRone (BUSPAR) 5 MG tablet Take 1 tablet (5 mg total) by mouth 3 (three) times daily.  . [DISCONTINUED] traMADol (ULTRAM) 50 MG tablet Take 0.5-1 tablets (25-50 mg total) by mouth every 8 (eight) hours as needed.   No facility-administered encounter medications on file as of 09/15/2016.     Allergies: Patient has no known allergies.  Body mass index is 22.75 kg/m.  Blood pressure (!) 153/84, pulse 80, height 5' 5" (1.651 m), weight 136 lb 11.2 oz (62 kg).     Review of Systems  Constitutional: Positive for fatigue. Negative  for activity change, appetite change, chills, diaphoresis, fever and unexpected weight change.  Eyes: Negative for visual disturbance.  Respiratory: Negative for cough, chest tightness, shortness of breath, wheezing and stridor.   Cardiovascular: Negative for chest pain, palpitations and leg swelling.  Gastrointestinal: Negative for abdominal distention, abdominal pain, blood in stool, constipation, diarrhea, nausea and vomiting.  Endocrine: Negative for cold intolerance, heat intolerance, polydipsia, polyphagia and polyuria.  Genitourinary: Negative for difficulty urinating and flank pain.  Musculoskeletal: Negative for back pain and gait problem.  Skin: Negative for color change, pallor, rash and wound.  Neurological: Negative for weakness and headaches.  Hematological: Does not bruise/bleed easily.   Psychiatric/Behavioral: Negative for decreased concentration, hallucinations, self-injury, sleep disturbance and suicidal ideas. The patient is not nervous/anxious and is not hyperactive.        Objective:   Physical Exam  Constitutional: She is oriented to person, place, and time. She appears well-developed and well-nourished. No distress.  Cardiovascular: Normal rate, regular rhythm, normal heart sounds and intact distal pulses.   No murmur heard. Pulmonary/Chest: Effort normal and breath sounds normal. No respiratory distress. She has no wheezes. She has no rales. She exhibits no tenderness.  Neurological: She is alert and oriented to person, place, and time.  Skin: Skin is warm and dry. No rash noted. She is not diaphoretic. No erythema. No pallor.  Psychiatric: She has a normal mood and affect. Her behavior is normal. Judgment and thought content normal.  Nursing note and vitals reviewed.         Assessment & Plan:   1. Screening for breast cancer   2. Essential hypertension   3. Osteoporosis, unspecified osteoporosis type, unspecified pathological fracture presence   4. Healthcare maintenance     Essential hypertension BP above goal 153/84-she reports taking both antihypertensives this morning.  Continue Enalapril 2.61m and HCTZ 25 mg daily.  Osteoporosis Prolia 662minjections every 6 months. She has DEXA scan scheduled for this month.  Healthcare maintenance Refills sent in for Vasotec and HCTZ. Please increase water intake, strive for at least 70 ounces/day. Continue daily walking-GREAT! Please continue with Osteoporosis care per Orthopedic Specialist. Please schedule full physical and fasting labs this fall. Mammogram order placed and we will provide Cologuard kit at annual physical.    FOLLOW-UP:  Return in about 4 weeks (around 10/13/2016) for CPE, Fasting Lab Draw.

## 2016-09-15 NOTE — Assessment & Plan Note (Signed)
Refills sent in for Vasotec and HCTZ. Please increase water intake, strive for at least 70 ounces/day. Continue daily walking-GREAT! Please continue with Osteoporosis care per Orthopedic Specialist. Please schedule full physical and fasting labs this fall. Mammogram order placed and we will provide Cologuard kit at annual physical.

## 2016-09-15 NOTE — Patient Instructions (Signed)
Heart-Healthy Eating Plan Many factors influence your heart health, including eating and exercise habits. Heart (coronary) risk increases with abnormal blood fat (lipid) levels. Heart-healthy meal planning includes limiting unhealthy fats, increasing healthy fats, and making other small dietary changes. This includes maintaining a healthy body weight to help keep lipid levels within a normal range. What is my plan? Your health care provider recommends that you:  Get no more than __25__% of the total calories in your daily diet from fat.  Limit your intake of saturated fat to less than __5___% of your total calories each day.  Limit the amount of cholesterol in your diet to less than __300__ mg per day.  What types of fat should I choose?  Choose healthy fats more often. Choose monounsaturated and polyunsaturated fats, such as olive oil and canola oil, flaxseeds, walnuts, almonds, and seeds.  Eat more omega-3 fats. Good choices include salmon, mackerel, sardines, tuna, flaxseed oil, and ground flaxseeds. Aim to eat fish at least two times each week.  Limit saturated fats. Saturated fats are primarily found in animal products, such as meats, butter, and cream. Plant sources of saturated fats include palm oil, palm kernel oil, and coconut oil.  Avoid foods with partially hydrogenated oils in them. These contain trans fats. Examples of foods that contain trans fats are stick margarine, some tub margarines, cookies, crackers, and other baked goods. What general guidelines do I need to follow?  Check food labels carefully to identify foods with trans fats or high amounts of saturated fat.  Fill one half of your plate with vegetables and green salads. Eat 4-5 servings of vegetables per day. A serving of vegetables equals 1 cup of raw leafy vegetables,  cup of raw or cooked cut-up vegetables, or  cup of vegetable juice.  Fill one fourth of your plate with whole grains. Look for the word "whole"  as the first word in the ingredient list.  Fill one fourth of your plate with lean protein foods.  Eat 4-5 servings of fruit per day. A serving of fruit equals one medium whole fruit,  cup of dried fruit,  cup of fresh, frozen, or canned fruit, or  cup of 100% fruit juice.  Eat more foods that contain soluble fiber. Examples of foods that contain this type of fiber are apples, broccoli, carrots, beans, peas, and barley. Aim to get 20-30 g of fiber per day.  Eat more home-cooked food and less restaurant, buffet, and fast food.  Limit or avoid alcohol.  Limit foods that are high in starch and sugar.  Avoid fried foods.  Cook foods by using methods other than frying. Baking, boiling, grilling, and broiling are all great options. Other fat-reducing suggestions include: ? Removing the skin from poultry. ? Removing all visible fats from meats. ? Skimming the fat off of stews, soups, and gravies before serving them. ? Steaming vegetables in water or broth.  Lose weight if you are overweight. Losing just 5-10% of your initial body weight can help your overall health and prevent diseases such as diabetes and heart disease.  Increase your consumption of nuts, legumes, and seeds to 4-5 servings per week. One serving of dried beans or legumes equals  cup after being cooked, one serving of nuts equals 1 ounces, and one serving of seeds equals  ounce or 1 tablespoon.  You may need to monitor your salt (sodium) intake, especially if you have high blood pressure. Talk with your health care provider or dietitian to get  more information about reducing sodium. What foods can I eat? Grains  Breads, including [place], white, pita, wheat, raisin, rye, oatmeal, and [place]. Tortillas that are neither fried nor made with lard or trans fat. Low-fat rolls, including hotdog and hamburger buns and English muffins. Biscuits. Muffins. Waffles. Pancakes. Light popcorn. Whole-grain cereals. Flatbread. Melba  toast. Pretzels. Breadsticks. Rusks. Low-fat snacks and crackers, including oyster, saltine, matzo, graham, animal, and rye. Rice and pasta, including brown rice and those that are made with whole wheat. Vegetables All vegetables. Fruits All fruits, but limit coconut. Meats and Other Protein Sources Lean, well-trimmed beef, veal, pork, and lamb. Chicken and [place] without skin. All fish and shellfish. Wild duck, rabbit, pheasant, and venison. Egg whites or low-cholesterol egg substitutes. Dried beans, peas, lentils, and tofu.Seeds and most nuts. Dairy Low-fat or nonfat cheeses, including ricotta, string, and mozzarella. Skim or 1% milk that is liquid, powdered, or evaporated. Buttermilk that is made with low-fat milk. Nonfat or low-fat yogurt. Beverages Mineral water. Diet carbonated beverages. Sweets and Desserts Sherbets and fruit ices. Honey, jam, marmalade, jelly, and syrups. Meringues and gelatins. Pure sugar candy, such as hard candy, jelly beans, gumdrops, mints, marshmallows, and small amounts of dark chocolate. [employer] Eat all sweets and desserts in moderation. Fats and Oils Nonhydrogenated (trans-free) margarines. Vegetable oils, including soybean, sesame, sunflower, olive, peanut, safflower, corn, canola, and cottonseed. Salad dressings or mayonnaise that are made with a vegetable oil. Limit added fats and oils that you use for cooking, baking, salads, and as spreads. Other Cocoa powder. Coffee and tea. All seasonings and condiments. The items listed above may not be a complete list of recommended foods or beverages. Contact your dietitian for more options. What foods are not recommended? Grains Breads that are made with saturated or trans fats, oils, or whole milk. Croissants. Butter rolls. Cheese breads. Sweet rolls. Donuts. Buttered popcorn. Chow mein noodles. High-fat crackers, such as cheese or butter crackers. Meats and Other Protein Sources Fatty meats, such as  hotdogs, short ribs, sausage, spareribs, bacon, ribeye roast or steak, and mutton. High-fat deli meats, such as salami and bologna. Caviar. Domestic duck and goose. Organ meats, such as kidney, liver, sweetbreads, brains, gizzard, chitterlings, and heart. Dairy Cream, sour cream, cream cheese, and creamed cottage cheese. Whole milk cheeses, including blue (bleu), Monterey Jack, [place], [place], [place], [place], [place], [place], [place], and [place]. Whole or 2% milk that is liquid, evaporated, or condensed. Whole buttermilk. Cream sauce or high-fat cheese sauce. Yogurt that is made from whole milk. Beverages Regular sodas and drinks with added sugar. Sweets and Desserts Frosting. Pudding. Cookies. Cakes other than angel food cake. Candy that has milk chocolate or white chocolate, hydrogenated fat, butter, coconut, or unknown ingredients. Buttered syrups. Full-fat ice cream or ice cream drinks. Fats and Oils Gravy that has suet, meat fat, or shortening. Cocoa butter, hydrogenated oils, palm oil, coconut oil, palm kernel oil. These can often be found in baked products, candy, fried foods, nondairy creamers, and whipped toppings. Solid fats and shortenings, including bacon fat, salt pork, lard, and butter. Nondairy cream substitutes, such as coffee creamers and sour cream substitutes. Salad dressings that are made of unknown oils, cheese, or sour cream. The items listed above may not be a complete list of foods and beverages to avoid. Contact your dietitian for more information. This information is not intended to replace advice given to you by your health care provider. Make sure you discuss any questions you have with your health care  provider. Document Released: 10/01/2007 Document Revised: 07/12/2015 Document Reviewed: 06/15/2013 Elsevier Interactive Patient Education  2017 [place] all medications as directed.  Refills sent in for Vasotec and HCTZ. Please increase water intake,  strive for at least 70 ounces/day. Continue daily walking-GREAT! Please continue with Osteoporosis care per Orthopedic Specialist. Please schedule full physical and fasting labs this fall. Mammogram order placed and we will provide Cologuard kit at annual physical. [place].

## 2016-09-16 DIAGNOSIS — E559 Vitamin D deficiency, unspecified: Secondary | ICD-10-CM | POA: Diagnosis not present

## 2016-09-16 DIAGNOSIS — M810 Age-related osteoporosis without current pathological fracture: Secondary | ICD-10-CM | POA: Diagnosis not present

## 2016-10-07 ENCOUNTER — Telehealth: Payer: Self-pay

## 2016-10-07 NOTE — Telephone Encounter (Signed)
LMVM to CB to review health maintenance.  Left my direct line.

## 2016-10-08 ENCOUNTER — Ambulatory Visit
Admission: RE | Admit: 2016-10-08 | Discharge: 2016-10-08 | Disposition: A | Discharge status: Home / Self Care | Payer: Medicare Other | Source: Ambulatory Visit | Attending: Adult Health | Admitting: Adult Health

## 2016-10-08 DIAGNOSIS — Z1231 Encounter for screening mammogram for malignant neoplasm of breast: Secondary | ICD-10-CM | POA: Diagnosis not present

## 2016-10-08 DIAGNOSIS — Z1239 Encounter for other screening for malignant neoplasm of breast: Secondary | ICD-10-CM

## 2016-10-15 ENCOUNTER — Encounter: Payer: Federal, State, Local not specified - PPO | Admitting: Adult Health

## 2016-10-21 ENCOUNTER — Encounter: Payer: Self-pay | Admitting: Adult Health

## 2016-10-21 ENCOUNTER — Ambulatory Visit (INDEPENDENT_AMBULATORY_CARE_PROVIDER_SITE_OTHER): Payer: Medicare Other | Admitting: Adult Health

## 2016-10-21 ENCOUNTER — Other Ambulatory Visit (HOSPITAL_COMMUNITY)
Admission: RE | Admit: 2016-10-21 | Discharge: 2016-10-21 | Disposition: A | Discharge status: Home / Self Care | Payer: Medicare Other | Source: Ambulatory Visit | Attending: Adult Health | Admitting: Adult Health

## 2016-10-21 VITALS — BP 134/81 | HR 71 | Ht 65.0 in | Wt 137.2 lb

## 2016-10-21 DIAGNOSIS — M818 Other osteoporosis without current pathological fracture: Secondary | ICD-10-CM

## 2016-10-21 DIAGNOSIS — Z0000 Encounter for general adult medical examination without abnormal findings: Secondary | ICD-10-CM | POA: Diagnosis not present

## 2016-10-21 DIAGNOSIS — Z8639 Personal history of other endocrine, nutritional and metabolic disease: Secondary | ICD-10-CM

## 2016-10-21 DIAGNOSIS — Z8249 Family history of ischemic heart disease and other diseases of the circulatory system: Secondary | ICD-10-CM | POA: Diagnosis not present

## 2016-10-21 DIAGNOSIS — Z124 Encounter for screening for malignant neoplasm of cervix: Secondary | ICD-10-CM

## 2016-10-21 DIAGNOSIS — Z833 Family history of diabetes mellitus: Secondary | ICD-10-CM

## 2016-10-21 DIAGNOSIS — Z23 Encounter for immunization: Secondary | ICD-10-CM | POA: Diagnosis not present

## 2016-10-21 DIAGNOSIS — I10 Essential (primary) hypertension: Secondary | ICD-10-CM | POA: Diagnosis not present

## 2016-10-21 NOTE — Assessment & Plan Note (Addendum)
Healthcare maintenance: PAP-completed today Requests Cologuard instead of Colonoscopy-kit provided Dexa Scan-  To be scheduled this fall via [place] 2020 Continue excellent water intake, heart healthy diet and FANTASTIC level of walking! We will call when lab results are available. F/u 6 months,HTN

## 2016-10-21 NOTE — Patient Instructions (Addendum)
Heart-Healthy Eating Plan Many factors influence your heart health, including eating and exercise habits. Heart (coronary) risk increases with abnormal blood fat (lipid) levels. Heart-healthy meal planning includes limiting unhealthy fats, increasing healthy fats, and making other small dietary changes. This includes maintaining a healthy body weight to help keep lipid levels within a normal range. What is my plan? Your health care provider recommends that you:  Get no more than __25___% of the total calories in your daily diet from fat.  Limit your intake of saturated fat to less than ___5__% of your total calories each day.  Limit the amount of cholesterol in your diet to less than __300__ mg per day.  What types of fat should I choose?  Choose healthy fats more often. Choose monounsaturated and polyunsaturated fats, such as olive oil and canola oil, flaxseeds, walnuts, almonds, and seeds.  Eat more omega-3 fats. Good choices include salmon, mackerel, sardines, tuna, flaxseed oil, and ground flaxseeds. Aim to eat fish at least two times each week.  Limit saturated fats. Saturated fats are primarily found in animal products, such as meats, butter, and cream. Plant sources of saturated fats include palm oil, palm kernel oil, and coconut oil.  Avoid foods with partially hydrogenated oils in them. These contain trans fats. Examples of foods that contain trans fats are stick margarine, some tub margarines, cookies, crackers, and other baked goods. What general guidelines do I need to follow?  Check food labels carefully to identify foods with trans fats or high amounts of saturated fat.  Fill one half of your plate with vegetables and green salads. Eat 4-5 servings of vegetables per day. A serving of vegetables equals 1 cup of raw leafy vegetables,  cup of raw or cooked cut-up vegetables, or  cup of vegetable juice.  Fill one fourth of your plate with whole grains. Look for the word "whole"  as the first word in the ingredient list.  Fill one fourth of your plate with lean protein foods.  Eat 4-5 servings of fruit per day. A serving of fruit equals one medium whole fruit,  cup of dried fruit,  cup of fresh, frozen, or canned fruit, or  cup of 100% fruit juice.  Eat more foods that contain soluble fiber. Examples of foods that contain this type of fiber are apples, broccoli, carrots, beans, peas, and barley. Aim to get 20-30 g of fiber per day.  Eat more home-cooked food and less restaurant, buffet, and fast food.  Limit or avoid alcohol.  Limit foods that are high in starch and sugar.  Avoid fried foods.  Cook foods by using methods other than frying. Baking, boiling, grilling, and broiling are all great options. Other fat-reducing suggestions include: ? Removing the skin from poultry. ? Removing all visible fats from meats. ? Skimming the fat off of stews, soups, and gravies before serving them. ? Steaming vegetables in water or broth.  Lose weight if you are overweight. Losing just 5-10% of your initial body weight can help your overall health and prevent diseases such as diabetes and heart disease.  Increase your consumption of nuts, legumes, and seeds to 4-5 servings per week. One serving of dried beans or legumes equals  cup after being cooked, one serving of nuts equals 1 ounces, and one serving of seeds equals  ounce or 1 tablespoon.  You may need to monitor your salt (sodium) intake, especially if you have high blood pressure. Talk with your health care provider or dietitian to get  more information about reducing sodium. What foods can I eat? Grains  Breads, including [place], white, pita, wheat, raisin, rye, oatmeal, and [place]. Tortillas that are neither fried nor made with lard or trans fat. Low-fat rolls, including hotdog and hamburger buns and English muffins. Biscuits. Muffins. Waffles. Pancakes. Light popcorn. Whole-grain cereals. Flatbread. Melba  toast. Pretzels. Breadsticks. Rusks. Low-fat snacks and crackers, including oyster, saltine, matzo, graham, animal, and rye. Rice and pasta, including brown rice and those that are made with whole wheat. Vegetables All vegetables. Fruits All fruits, but limit coconut. Meats and Other Protein Sources Lean, well-trimmed beef, veal, pork, and lamb. Chicken and [place] without skin. All fish and shellfish. Wild duck, rabbit, pheasant, and venison. Egg whites or low-cholesterol egg substitutes. Dried beans, peas, lentils, and tofu.Seeds and most nuts. Dairy Low-fat or nonfat cheeses, including ricotta, string, and mozzarella. Skim or 1% milk that is liquid, powdered, or evaporated. Buttermilk that is made with low-fat milk. Nonfat or low-fat yogurt. Beverages Mineral water. Diet carbonated beverages. Sweets and Desserts Sherbets and fruit ices. Honey, jam, marmalade, jelly, and syrups. Meringues and gelatins. Pure sugar candy, such as hard candy, jelly beans, gumdrops, mints, marshmallows, and small amounts of dark chocolate. [employer] Eat all sweets and desserts in moderation. Fats and Oils Nonhydrogenated (trans-free) margarines. Vegetable oils, including soybean, sesame, sunflower, olive, peanut, safflower, corn, canola, and cottonseed. Salad dressings or mayonnaise that are made with a vegetable oil. Limit added fats and oils that you use for cooking, baking, salads, and as spreads. Other Cocoa powder. Coffee and tea. All seasonings and condiments. The items listed above may not be a complete list of recommended foods or beverages. Contact your dietitian for more options. What foods are not recommended? Grains Breads that are made with saturated or trans fats, oils, or whole milk. Croissants. Butter rolls. Cheese breads. Sweet rolls. Donuts. Buttered popcorn. Chow mein noodles. High-fat crackers, such as cheese or butter crackers. Meats and Other Protein Sources Fatty meats, such as  hotdogs, short ribs, sausage, spareribs, bacon, ribeye roast or steak, and mutton. High-fat deli meats, such as salami and bologna. Caviar. Domestic duck and goose. Organ meats, such as kidney, liver, sweetbreads, brains, gizzard, chitterlings, and heart. Dairy Cream, sour cream, cream cheese, and creamed cottage cheese. Whole milk cheeses, including blue (bleu), [place], [place], [place], [place], [place], [place], [place], [place], and [place]. Whole or 2% milk that is liquid, evaporated, or condensed. Whole buttermilk. Cream sauce or high-fat cheese sauce. Yogurt that is made from whole milk. Beverages Regular sodas and drinks with added sugar. Sweets and Desserts Frosting. Pudding. Cookies. Cakes other than angel food cake. Candy that has milk chocolate or white chocolate, hydrogenated fat, butter, coconut, or unknown ingredients. Buttered syrups. Full-fat ice cream or ice cream drinks. Fats and Oils Gravy that has suet, meat fat, or shortening. Cocoa butter, hydrogenated oils, palm oil, coconut oil, palm kernel oil. These can often be found in baked products, candy, fried foods, nondairy creamers, and whipped toppings. Solid fats and shortenings, including bacon fat, salt pork, lard, and butter. Nondairy cream substitutes, such as coffee creamers and sour cream substitutes. Salad dressings that are made of unknown oils, cheese, or sour cream. The items listed above may not be a complete list of foods and beverages to avoid. Contact your dietitian for more information. This information is not intended to replace advice given to you by your health care provider. Make sure you discuss any questions you have with your health care  provider. Document Released: 10/01/2007 Document Revised: 07/12/2015 Document Reviewed: 06/15/2013 Elsevier Interactive Patient Education  2017 [place].   Hypertension Hypertension, commonly called high blood pressure, is when the force of blood pumping through the  arteries is too strong. The arteries are the blood vessels that carry blood from the heart throughout the body. Hypertension forces the heart to work harder to pump blood and may cause arteries to become narrow or stiff. Having untreated or uncontrolled hypertension can cause heart attacks, strokes, kidney disease, and other problems. A blood pressure reading consists of a higher number over a lower number. Ideally, your blood pressure should be below 120/80. The first ("top") number is called the systolic pressure. It is a measure of the pressure in your arteries as your heart beats. The second ("bottom") number is called the diastolic pressure. It is a measure of the pressure in your arteries as the heart relaxes. What are the causes? The cause of this condition is not known. What increases the risk? Some risk factors for high blood pressure are under your control. Others are not. Factors you can change  Smoking.  Having type 2 diabetes mellitus, high cholesterol, or both.  Not getting enough exercise or physical activity.  Being overweight.  Having too much fat, sugar, calories, or salt (sodium) in your diet.  Drinking too much alcohol. Factors that are difficult or impossible to change  Having chronic kidney disease.  Having a family history of high blood pressure.  Age. Risk increases with age.  Race. You may be at higher risk if you are African-American.  Gender. Men are at higher risk than women before age 76. After age 34, women are at higher risk than men.  Having obstructive sleep apnea.  Stress. What are the signs or symptoms? Extremely high blood pressure (hypertensive crisis) may cause:  Headache.  Anxiety.  Shortness of breath.  Nosebleed.  Nausea and vomiting.  Severe chest pain.  Jerky movements you cannot control (seizures).  How is this diagnosed? This condition is diagnosed by measuring your blood pressure while you are seated, with your arm  resting on a surface. The cuff of the blood pressure monitor will be placed directly against the skin of your upper arm at the level of your heart. It should be measured at least twice using the same arm. Certain conditions can cause a difference in blood pressure between your right and left arms. Certain factors can cause blood pressure readings to be lower or higher than normal (elevated) for a short period of time:  When your blood pressure is higher when you are in a health care provider's office than when you are at home, this is called white coat hypertension. Most people with this condition do not need medicines.  When your blood pressure is higher at home than when you are in a health care provider's office, this is called masked hypertension. Most people with this condition may need medicines to control blood pressure.  If you have a high blood pressure reading during one visit or you have normal blood pressure with other risk factors:  You may be asked to return on a different day to have your blood pressure checked again.  You may be asked to monitor your blood pressure at home for 1 week or longer.  If you are diagnosed with hypertension, you may have other blood or imaging tests to help your health care provider understand your overall risk for other conditions. How is this treated? This  condition is treated by making healthy lifestyle changes, such as eating healthy foods, exercising more, and reducing your alcohol intake. Your health care provider may prescribe medicine if lifestyle changes are not enough to get your blood pressure under control, and if:  Your systolic blood pressure is above 130.  Your diastolic blood pressure is above 80.  Your personal target blood pressure may vary depending on your medical conditions, your age, and other factors. Follow these instructions at home: Eating and drinking  Eat a diet that is high in fiber and potassium, and low in sodium,  added sugar, and fat. An example eating plan is called the DASH (Dietary Approaches to Stop Hypertension) diet. To eat this way: ? Eat plenty of fresh fruits and vegetables. Try to fill half of your plate at each meal with fruits and vegetables. ? Eat whole grains, such as whole wheat pasta, brown rice, or whole grain bread. Fill about one quarter of your plate with whole grains. ? Eat or drink low-fat dairy products, such as skim milk or low-fat yogurt. ? Avoid fatty cuts of meat, processed or cured meats, and poultry with skin. Fill about one quarter of your plate with lean proteins, such as fish, chicken without skin, beans, eggs, and tofu. ? Avoid premade and processed foods. These tend to be higher in sodium, added sugar, and fat.  Reduce your daily sodium intake. Most people with hypertension should eat less than 1,500 mg of sodium a day.  Limit alcohol intake to no more than 1 drink a day for nonpregnant women and 2 drinks a day for men. One drink equals 12 oz of beer, 5 oz of wine, or 1 oz of hard liquor. Lifestyle  Work with your health care provider to maintain a healthy body weight or to lose weight. Ask what an ideal weight is for you.  Get at least 30 minutes of exercise that causes your heart to beat faster (aerobic exercise) most days of the week. Activities may include walking, swimming, or biking.  Include exercise to strengthen your muscles (resistance exercise), such as pilates or lifting weights, as part of your weekly exercise routine. Try to do these types of exercises for 30 minutes at least 3 days a week.  Do not use any products that contain nicotine or tobacco, such as cigarettes and e-cigarettes. If you need help quitting, ask your health care provider.  Monitor your blood pressure at home as told by your health care provider.  Keep all follow-up visits as told by your health care provider. This is important. Medicines  Take over-the-counter and prescription  medicines only as told by your health care provider. Follow directions carefully. Blood pressure medicines must be taken as prescribed.  Do not skip doses of blood pressure medicine. Doing this puts you at risk for problems and can make the medicine less effective.  Ask your health care provider about side effects or reactions to medicines that you should watch for. Contact a health care provider if:  You think you are having a reaction to a medicine you are taking.  You have headaches that keep coming back (recurring).  You feel dizzy.  You have swelling in your ankles.  You have trouble with your vision. Get help right away if:  You develop a severe headache or confusion.  You have unusual weakness or numbness.  You feel faint.  You have severe pain in your chest or abdomen.  You vomit repeatedly.  You have trouble breathing.  Summary  Hypertension is when the force of blood pumping through your arteries is too strong. If this condition is not controlled, it may put you at risk for serious complications.  Your personal target blood pressure may vary depending on your medical conditions, your age, and other factors. For most people, a normal blood pressure is less than 120/80.  Hypertension is treated with lifestyle changes, medicines, or a combination of both. Lifestyle changes include weight loss, eating a healthy, low-sodium diet, exercising more, and limiting alcohol. This information is not intended to replace advice given to you by your health care provider. Make sure you discuss any questions you have with your health care provider. Document Released: 12/22/2004 Document Revised: 11/20/2015 Document Reviewed: 11/20/2015 Elsevier Interactive Patient Education  2018 [employer]  Continue excellent water intake, heart healthy diet and FANTASTIC level of walking! We will call when lab results are available. Follow-up 1 year high blood pressure. NICE TO SEE YOU!!!

## 2016-10-21 NOTE — Progress Notes (Signed)
Subjective:    Patient ID: Lisa Sloan, female    DOB: [birth year], 66 y.o.   MRN: [id number]  HPI:09/15/2016 OV Notes:  Lisa Sloan presents to establish as a new pt.  She is a very pleasant 66 year old female.  PMH:  HTN, osteoporosis, and eczema.  [facility] has been treating her osteoporosis with Prolia injections every 6 months and she has scheduled DEXA scan this month.  She denies falls and walks >70mles/day.  Her diet consists primarily of vegetables and lean protein.  She denies tobacco use and enjoys a glass of wine nightly.  Today's OV Notes: Lisa Sloan here for CPE.   She continues to drink >2L water/day and follows a heart healthy diet.She walks >3 miles everyday. She denies tobacco and drinks 1-2 glasses of wine /day. Overall she feels that her health is excellent! She denies any acute changes since initial OV in Sept 2018 Healthcare maintenance: PAP-completed today Requests Cologuard instead of Colonoscopy-kit provided Dexa Scan-  To be scheduled this fall via [email or web address]  Patient Care Team    Relationship Specialty Notifications Start End  DEsaw Grandchild NP PCP - General Family Medicine  09/16/16   BVerner Chol MD Consulting Physician Sports Medicine  09/16/16   [facility]  09/16/16     Patient Active Problem List   Diagnosis Date Noted  . Healthcare maintenance 09/15/2016  . Osteoporosis 08/04/2015  . Eczema 08/04/2015  . Essential hypertension 01/09/2014     Past Medical History:  Diagnosis Date  . Hypertension   . Orthostatic dizziness   . Osteoporosis      Past Surgical History:  Procedure Laterality Date  . CESAREAN SECTION    . FRACTURE SURGERY     ankle and wrist  . HALLUX VALGUS CORRECTION       Family History  Problem Relation Age of Onset  . Diabetes Mother   . Heart disease Mother        diabetic induced  . Hypertension Father   . Cancer Father 977      colon and skin  .  Dementia Father   . Heart disease Father   . Kidney failure Father   . Diabetes Maternal Grandmother      History  Drug Use No     History  Alcohol Use  . 14.4 oz/week  . 10 Standard drinks or equivalent, 14 Glasses of wine per week    Comment: wine     History  Smoking Status  . Former Smoker  . Packs/day: 1.00  . Years: 5.00  . Types: Cigarettes  . Quit date: 01/06/1983  Smokeless Tobacco  . Never Used     Outpatient Encounter Prescriptions as of 10/21/2016  Medication Sig  . aspirin 325 MG EC tablet Take 325 mg by mouth daily as needed for pain.  .Marland Kitchendenosumab (PROLIA) 60 MG/ML SOLN injection Inject 60 mg into the skin every 6 (six) months. Administer in upper arm, thigh, or abdomen  . desonide (DESOWEN) 0.05 % cream Apply topically 2 (two) times daily. For maximum 10 days. (Patient taking differently: Apply 1 application topically 2 (two) times daily as needed (eczema.). For maximum 10 days.)  . enalapril (VASOTEC) 2.5 MG tablet Take 1 tablet (2.5 mg total) by mouth daily.  . hydrochlorothiazide (HYDRODIURIL) 25 MG tablet Take 1 tablet (25 mg total) by mouth daily. NEEDS APPT 08/2016  . Multiple Vitamin (MULTIVITAMIN WITH MINERALS)  TABS tablet Take 1 tablet by mouth daily.   No facility-administered encounter medications on file as of 10/21/2016.     Allergies: Patient has no known allergies.  Body mass index is 22.83 kg/m.  Blood pressure 134/81, pulse 71, height _0  (1.651 m), weight 137 lb 3.2 oz (62.2 kg).     Review of Systems  Constitutional: Positive for fatigue. Negative for activity change, appetite change, chills, diaphoresis, fever and unexpected weight change.  Eyes: Negative for visual disturbance.  Respiratory: Negative for cough, chest tightness, shortness of breath, wheezing and stridor.   Cardiovascular: Negative for chest pain, palpitations and leg swelling.  Gastrointestinal: Negative for abdominal distention, abdominal pain, blood in  stool, constipation, diarrhea, nausea and vomiting.  Endocrine: Negative for cold intolerance, heat intolerance, polydipsia, polyphagia and polyuria.  Genitourinary: Negative for difficulty urinating and flank pain.  Musculoskeletal: Negative for back pain and gait problem.  Skin: Negative for color change, pallor, rash and wound.  Neurological: Negative for weakness and headaches.  Hematological: Does not bruise/bleed easily.  Psychiatric/Behavioral: Negative for decreased concentration, hallucinations, self-injury, sleep disturbance and suicidal ideas. The patient is not nervous/anxious and is not hyperactive.        Objective:   Physical Exam  Constitutional: She is oriented to person, place, and time. She appears well-developed and well-nourished. No distress.  HENT:  Head: Normocephalic and atraumatic.  Right Ear: Hearing, tympanic membrane, external ear and ear canal normal. Tympanic membrane is not erythematous and not bulging. No decreased hearing is noted.  Left Ear: Hearing, tympanic membrane, external ear and ear canal normal. Tympanic membrane is not erythematous and not bulging. No decreased hearing is noted.  Nose: Nose normal. Right sinus exhibits no maxillary sinus tenderness and no frontal sinus tenderness. Left sinus exhibits no frontal sinus tenderness.  Mouth/Throat: Uvula is midline, oropharynx is clear and moist and mucous membranes are normal.  Eyes: Pupils are equal, round, and reactive to light. Conjunctivae are normal.  Neck: Normal range of motion. Neck supple.  Cardiovascular: Normal rate, regular rhythm, normal heart sounds and intact distal pulses.   No murmur heard. Pulmonary/Chest: Effort normal and breath sounds normal. No respiratory distress. She has no wheezes. She has no rales. She exhibits no tenderness. Right breast exhibits no inverted nipple, no mass, no nipple discharge, no skin change and no tenderness. Left breast exhibits no inverted nipple, no  mass, no nipple discharge, no skin change and no tenderness. Breasts are symmetrical.  Abdominal: Soft. Bowel sounds are normal. She exhibits no distension and no mass. There is no tenderness. There is no rebound and no guarding.  Genitourinary: Vagina normal and uterus normal. Pelvic exam was performed with patient supine. There is no tenderness on the right labia. There is no tenderness on the left labia. No vaginal discharge found.  Genitourinary Comments: Chaperone present during examination  Lymphadenopathy:    She has no cervical adenopathy.  Neurological: She is alert and oriented to person, place, and time.  Skin: Skin is warm and dry. No rash noted. She is not diaphoretic. No erythema. No pallor.  Patches of eczema noted on back of neck, L hand, and bil inner thighs  Psychiatric: She has a normal mood and affect. Her behavior is normal. Judgment and thought content normal.  Nursing note and vitals reviewed.         Assessment & Plan:   1. Screening for cervical cancer   2. Need for influenza vaccination   3. Need for pneumococcal  vaccination   4. Essential hypertension   5. Age-related osteoporosis without fracture   6. Healthcare maintenance   7. Family history of diabetes mellitus   8. History of elevated glucose   9. Family history of cardiovascular disease     Healthcare maintenance Healthcare maintenance: PAP-completed today Requests Cologuard instead of Colonoscopy-kit provided Dexa Scan-  To be scheduled this fall via [facility] 2020 Continue excellent water intake, heart healthy diet and FANTASTIC level of walking! We will call when lab results are available. F/u 6 months,HTN    FOLLOW-UP:  Return in about 1 year (around 10/21/2017) for HTN. 1. Screening for cervical cancer   2. Need for influenza vaccination   3. Need for pneumococcal vaccination   4. Essential hypertension   5. Age-related osteoporosis without fracture   6.  Healthcare maintenance   7. Family history of diabetes mellitus   8. History of elevated glucose   9. Family history of cardiovascular disease     Healthcare maintenance Healthcare maintenance: PAP-completed today Requests Cologuard instead of Colonoscopy-kit provided Dexa Scan-  To be scheduled this fall via [place] 2020 Continue excellent water intake, heart healthy diet and FANTASTIC level of walking! We will call when lab results are available. F/u 6 months,HTN    FOLLOW-UP:  Return in about 1 year (around 10/21/2017) for HTN.

## 2016-10-22 LAB — CBC WITH DIFFERENTIAL/PLATELET
BASOS: 1 %
Basophils Absolute: 0 10*3/uL (ref 0.0–0.2)
EOS (ABSOLUTE): 0 10*3/uL (ref 0.0–0.4)
EOS: 1 %
HEMATOCRIT: 44 % (ref 34.0–46.6)
Hemoglobin: 14.4 g/dL (ref 11.1–15.9)
Immature Grans (Abs): 0 10*3/uL (ref 0.0–0.1)
Immature Granulocytes: 0 %
LYMPHS ABS: 1.1 10*3/uL (ref 0.7–3.1)
Lymphs: 32 %
MCH: 30.1 pg (ref 26.6–33.0)
MCHC: 32.7 g/dL (ref 31.5–35.7)
MCV: 92 fL (ref 79–97)
MONOS ABS: 0.3 10*3/uL (ref 0.1–0.9)
Monocytes: 10 %
NEUTROS ABS: 1.9 10*3/uL (ref 1.4–7.0)
Neutrophils: 56 %
Platelets: 191 10*3/uL (ref 150–379)
RBC: 4.79 x10E6/uL (ref 3.77–5.28)
RDW: 13.8 % (ref 12.3–15.4)
WBC: 3.3 10*3/uL — ABNORMAL LOW (ref 3.4–10.8)

## 2016-10-22 LAB — COMPREHENSIVE METABOLIC PANEL
A/G RATIO: 1.9 (ref 1.2–2.2)
ALK PHOS: 64 IU/L (ref 39–117)
ALT: 16 IU/L (ref 0–32)
AST: 23 IU/L (ref 0–40)
Albumin: 4.6 g/dL (ref 3.6–4.8)
BUN / CREAT RATIO: 14 (ref 12–28)
BUN: 12 mg/dL (ref 8–27)
Bilirubin Total: 0.5 mg/dL (ref 0.0–1.2)
CALCIUM: 9.3 mg/dL (ref 8.7–10.3)
CHLORIDE: 102 mmol/L (ref 96–106)
CO2: 25 mmol/L (ref 20–29)
Creatinine, Ser: 0.84 mg/dL (ref 0.57–1.00)
GFR calc Af Amer: 84 mL/min/{1.73_m2} (ref >59)
GFR, EST NON AFRICAN AMERICAN: 73 mL/min/{1.73_m2} (ref >59)
GLOBULIN, TOTAL: 2.4 g/dL (ref 1.5–4.5)
Glucose: 70 mg/dL (ref 65–99)
Potassium: 3.9 mmol/L (ref 3.5–5.2)
SODIUM: 142 mmol/L (ref 134–144)
Total Protein: 7 g/dL (ref 6.0–8.5)

## 2016-10-22 LAB — LIPID PANEL
CHOLESTEROL TOTAL: 223 mg/dL — AB (ref 100–199)
Chol/HDL Ratio: 2.8 ratio (ref 0.0–4.4)
HDL: 80 mg/dL (ref >39)
LDL CALC: 113 mg/dL — AB (ref 0–99)
Triglycerides: 148 mg/dL (ref 0–149)
VLDL Cholesterol Cal: 30 mg/dL (ref 5–40)

## 2016-10-22 LAB — CYTOLOGY - PAP: Diagnosis: NEGATIVE

## 2016-10-22 LAB — TSH: TSH: 2.02 u[IU]/mL (ref 0.450–4.500)

## 2016-10-22 LAB — HEMOGLOBIN A1C
Est. average glucose Bld gHb Est-mCnc: 100 mg/dL
Hgb A1c MFr Bld: 5.1 % (ref 4.8–5.6)

## 2016-10-22 LAB — VITAMIN D 25 HYDROXY (VIT D DEFICIENCY, FRACTURES): Vit D, 25-Hydroxy: 46.9 ng/mL (ref 30.0–100.0)

## 2016-11-11 ENCOUNTER — Telehealth: Payer: Self-pay

## 2016-11-11 NOTE — Telephone Encounter (Signed)
Received Epic notification that pt has not read MyChart message regarding results.    Ms. Murrell ConverseHuber,   Katy asked that I share with you that your complete blood count, metabolic panel, A1c (3 month average of blood sugars), Vitamin D level, thyroid test and pap smear were all normal.   Your lipid panel was:  Total cholesterol was 223 (normal is less than 200)  Triglycerides were 148 (normal is less than 150)  HDL (good cholesterol) was 80-EXCELLENT!  LDL (bad cholesterol) was 478113 (normal is less than 99)  These numbers put your risk for an acute cardiovascular event (ie. Stroke or heart attack) at 7.4%, optimal is 3.7%.   Current guidelines recommend starting a moderate intensity statin at >7.5%  Yourr LDL was 95 last year, so Orpha BurKaty says that if you work hard to further reduce the saturated fat in your diet, hopefully that number will normalize.  She recommends that we re-check your lipids in 6 months and, if not improved, then she will discuss starting a statin therapy.   Overall labs look good!    LVM for pt to return call to discuss.  Tiajuana Amass. Nelson, CMA

## 2016-11-12 NOTE — Telephone Encounter (Signed)
Pt informed of results.  Pt expressed understanding and is agreeable.  T. Tattiana Fakhouri, CMA 

## 2016-11-13 NOTE — Progress Notes (Signed)
Order for Cologuard faxed to the company.  Pt informed.  Lisa Sloan. Shukri Nistler, CMA

## 2017-03-15 DIAGNOSIS — Z1212 Encounter for screening for malignant neoplasm of rectum: Secondary | ICD-10-CM | POA: Diagnosis not present

## 2017-03-15 DIAGNOSIS — Z1211 Encounter for screening for malignant neoplasm of colon: Secondary | ICD-10-CM | POA: Diagnosis not present

## 2017-03-19 DIAGNOSIS — M810 Age-related osteoporosis without current pathological fracture: Secondary | ICD-10-CM | POA: Diagnosis not present

## 2017-03-19 DIAGNOSIS — M8589 Other specified disorders of bone density and structure, multiple sites: Secondary | ICD-10-CM | POA: Diagnosis not present

## 2017-03-19 DIAGNOSIS — R5383 Other fatigue: Secondary | ICD-10-CM | POA: Diagnosis not present

## 2017-03-19 DIAGNOSIS — E559 Vitamin D deficiency, unspecified: Secondary | ICD-10-CM | POA: Diagnosis not present

## 2017-03-25 DIAGNOSIS — R195 Other fecal abnormalities: Secondary | ICD-10-CM

## 2017-03-25 LAB — COLOGUARD: Cologuard: POSITIVE

## 2017-03-29 ENCOUNTER — Encounter: Payer: Self-pay | Admitting: Gastroenterology

## 2017-03-30 DIAGNOSIS — M810 Age-related osteoporosis without current pathological fracture: Secondary | ICD-10-CM | POA: Diagnosis not present

## 2017-03-30 DIAGNOSIS — E559 Vitamin D deficiency, unspecified: Secondary | ICD-10-CM | POA: Diagnosis not present

## 2017-05-04 ENCOUNTER — Other Ambulatory Visit: Payer: Self-pay

## 2017-05-04 ENCOUNTER — Ambulatory Visit (AMBULATORY_SURGERY_CENTER): Payer: Self-pay

## 2017-05-04 VITALS — Ht 66.0 in | Wt 140.6 lb

## 2017-05-04 DIAGNOSIS — R195 Other fecal abnormalities: Secondary | ICD-10-CM

## 2017-05-04 MED ORDER — PEG 3350-KCL-NA BICARB-NACL 420 G PO SOLR: 4000.0000 mL | Freq: Once | ORAL | 0 refills | Status: AC

## 2017-05-04 NOTE — Progress Notes (Signed)
Denies allergies to eggs or soy products. Denies complication of anesthesia or sedation. Denies use of weight loss medication. Denies use of O2.   Emmi instructions declined.  

## 2017-05-18 ENCOUNTER — Encounter: Payer: Medicare Other | Admitting: Gastroenterology

## 2017-06-01 ENCOUNTER — Encounter: Payer: Self-pay | Admitting: Family Medicine

## 2017-06-08 ENCOUNTER — Other Ambulatory Visit: Payer: Self-pay

## 2017-06-08 ENCOUNTER — Encounter: Payer: Self-pay | Admitting: Gastroenterology

## 2017-06-08 ENCOUNTER — Ambulatory Visit (AMBULATORY_SURGERY_CENTER): Payer: Medicare Other | Admitting: Gastroenterology

## 2017-06-08 VITALS — BP 131/71 | HR 67 | Temp 97.8°F | Resp 10 | Ht 65.0 in | Wt 137.0 lb

## 2017-06-08 DIAGNOSIS — R195 Other fecal abnormalities: Secondary | ICD-10-CM

## 2017-06-08 DIAGNOSIS — K639 Disease of intestine, unspecified: Secondary | ICD-10-CM

## 2017-06-08 DIAGNOSIS — K529 Noninfective gastroenteritis and colitis, unspecified: Secondary | ICD-10-CM | POA: Diagnosis not present

## 2017-06-08 DIAGNOSIS — Z1211 Encounter for screening for malignant neoplasm of colon: Secondary | ICD-10-CM | POA: Diagnosis not present

## 2017-06-08 MED ORDER — SODIUM CHLORIDE 0.9 % IV SOLN: 500.0000 mL | Freq: Once | INTRAVENOUS | Status: AC

## 2017-06-08 NOTE — Progress Notes (Signed)
.  A and O x3. Report to RN. Tolerated MAC anesthesia well.

## 2017-06-08 NOTE — Patient Instructions (Signed)
Handouts given:  Hemorrhoids  YOU HAD AN ENDOSCOPIC PROCEDURE TODAY AT THE [facility]:   Refer to the procedure report that was given to you for any specific questions about what was found during the examination.  If the procedure report does not answer your questions, please call your gastroenterologist to clarify.  If you requested that your care partner not be given the details of your procedure findings, then the procedure report has been included in a sealed envelope for you to review at your convenience later.  YOU SHOULD EXPECT: Some feelings of bloating in the abdomen. Passage of more gas than usual.  Walking can help get rid of the air that was put into your GI tract during the procedure and reduce the bloating. If you had a lower endoscopy (such as a colonoscopy or flexible sigmoidoscopy) you may notice spotting of blood in your stool or on the toilet paper. If you underwent a bowel prep for your procedure, you may not have a normal bowel movement for a few days.  Please Note:  You might notice some irritation and congestion in your nose or some drainage.  This is from the oxygen used during your procedure.  There is no need for concern and it should clear up in a day or so.  SYMPTOMS TO REPORT IMMEDIATELY:   Following lower endoscopy (colonoscopy or flexible sigmoidoscopy):  Excessive amounts of blood in the stool  Significant tenderness or worsening of abdominal pains  Swelling of the abdomen that is new, acute  Fever of 100F or higher  For urgent or emergent issues, a gastroenterologist can be reached at any hour by calling [phone].   DIET:  We do recommend a small meal at first, but then you may proceed to your regular diet.  Drink plenty of fluids but you should avoid alcoholic beverages for 24 hours.  ACTIVITY:  You should plan to take it easy for the rest of today and you should NOT DRIVE or use heavy machinery until tomorrow (because of the sedation  medicines used during the test).    FOLLOW UP: Our staff will call the number listed on your records the next business day following your procedure to check on you and address any questions or concerns that you may have regarding the information given to you following your procedure. If we do not reach you, we will leave a message.  However, if you are feeling well and you are not experiencing any problems, there is no need to return our call.  We will assume that you have returned to your regular daily activities without incident.  If any biopsies were taken you will be contacted by phone or by letter within the next 1-3 weeks.  Please call us at [phone] if you have not heard about the biopsies in 3 weeks.    SIGNATURES/CONFIDENTIALITY: You and/or your care partner have signed paperwork which will be entered into your electronic medical record.  These signatures attest to the fact that that the information above on your After Visit Summary has been reviewed and is understood.  Full responsibility of the confidentiality of this discharge information lies with you and/or your care-partner. 

## 2017-06-08 NOTE — Progress Notes (Signed)
Called to room to assist during endoscopic procedure.  Patient ID and intended procedure confirmed with present staff. Received instructions for my participation in the procedure from the performing physician.  

## 2017-06-08 NOTE — Op Note (Signed)
[facility] Patient Name: Lisa Sloan Procedure Date: 06/08/2017 3:06 PM MRN: [id number] Endoscopist: Meryl Dare , MD Age: 67 Referring MD:  Date of Birth: [birth year] Gender: Female Account #: [id number] Procedure:                Colonoscopy Indications:              Positive Cologuard test Medicines:                Monitored Anesthesia Care Procedure:                Pre-Anesthesia Assessment:                           - Prior to the procedure, a History and Physical                            was performed, and patient medications and                            allergies were reviewed. The patient's tolerance of                            previous anesthesia was also reviewed. The risks                            and benefits of the procedure and the sedation                            options and risks were discussed with the patient.                            All questions were answered, and informed consent                            was obtained. Prior Anticoagulants: The patient has                            taken no previous anticoagulant or antiplatelet                            agents. ASA Grade Assessment: II - A patient with                            mild systemic disease. After reviewing the risks                            and benefits, the patient was deemed in                            satisfactory condition to undergo the procedure.                           After obtaining informed consent, the colonoscope  was passed under direct vision. Throughout the                            procedure, the patient's blood pressure, pulse, and                            oxygen saturations were monitored continuously. The                            Colonoscope was introduced through the anus and                            advanced to the the terminal ileum, with                            identification of the appendiceal orifice and  IC                            valve. The terminal ileum, ileocecal valve,                            appendiceal orifice, and rectum were photographed.                            The quality of the bowel preparation was excellent.                            The colonoscopy was performed without difficulty.                            The patient tolerated the procedure well. Scope In: 3:15:50 PM Scope Out: 3:33:10 PM Scope Withdrawal Time: 0 hours 13 minutes 29 seconds  Total Procedure Duration: 0 hours 17 minutes 20 seconds  Findings:                 The perianal and digital rectal examinations were                            normal.                           Diffuse moderate mucosal changes characterized by                            scarring were found in the sigmoid colon, in the                            descending colon, at the splenic flexure, in the                            transverse colon, at the hepatic flexure, in the                            ascending colon and in the cecum. Biopsies were  taken with a cold forceps for histology.                           The terminal ileum appeared normal. Biopsies                            obtained.                           Internal hemorrhoids were found during                            retroflexion. The hemorrhoids were small and Grade                            I (internal hemorrhoids that do not prolapse).                           The exam was otherwise without abnormality on                            direct and retroflexion views. Complications:            No immediate complications. Estimated blood loss:                            None. Estimated Blood Loss:     Estimated blood loss: none. Impression:               - Diffuse moderate mucosal changes were found in                            the sigmoid colon, in the descending colon, at the                            splenic flexure, in the  transverse colon, at the                            hepatic flexure, in the ascending colon and in the                            cecum secondary to colitis. Biopsied.                           - The examined portion of the terminal ileum                            appeared normal.                           - Small internal hemorrhoids.                           - The examination was otherwise normal on direct  and retroflexion views. Recommendation:           - Repeat colonoscopy date to be determined after                            pending pathology results are reviewed for                            surveillance.                           - Patient has a contact number available for                            emergencies. The signs and symptoms of potential                            delayed complications were discussed with the                            patient. Return to normal activities tomorrow.                            Written discharge instructions were provided to the                            patient.                           - Resume previous diet.                           - Continue present medications.                           - Await pathology results.                           - Return to GI office in 1 month. Meryl Dare, MD 06/08/2017 3:40:14 PM This report has been signed electronically.

## 2017-06-08 NOTE — Progress Notes (Signed)
Pt's states no medical or surgical changes since previsit or office visit. 

## 2017-06-09 ENCOUNTER — Telehealth: Payer: Self-pay

## 2017-06-09 NOTE — Telephone Encounter (Signed)
  Follow up Call-  Call back number 06/08/2017  Post procedure Call Back phone  # [phone]  Permission to leave phone message Yes  Some recent data might be hidden     Patient questions:  Do you have a fever, pain , or abdominal swelling? No. Pain Score  0 *  Have you tolerated food without any problems? Yes.    Have you been able to return to your normal activities? Yes.    Do you have any questions about your discharge instructions: Diet   No. Medications  No. Follow up visit  No.  Do you have questions or concerns about your Care? No.  Actions: * If pain score is 4 or above: No action needed, pain <4.  Pt's number was [phone] and I spoke with pt.  No problems noted. maw

## 2017-06-14 ENCOUNTER — Telehealth: Payer: Self-pay | Admitting: Adult Health

## 2017-06-14 MED ORDER — ENALAPRIL MALEATE 2.5 MG PO TABS: 2.5000 mg | ORAL_TABLET | Freq: Every day | ORAL | 0 refills | Status: DC

## 2017-06-14 MED ORDER — HYDROCHLOROTHIAZIDE 25 MG PO TABS: 25.0000 mg | ORAL_TABLET | Freq: Every day | ORAL | 0 refills | Status: DC

## 2017-06-14 NOTE — Telephone Encounter (Signed)
Please call pt to schedule f/u.  No further refills on medications until pt is seen in office.  Tiajuana Amass. Brigitt Mcclish, CMA

## 2017-06-14 NOTE — Telephone Encounter (Signed)
Patient called states she has only this week worth of meds & needs refills on  :     1)---  enalapril (VASOTEC) 2.5 MG tablet [[id number]][[id number]]   Order Details  Dose: 2.5 mg Route: Oral Frequency: Daily  Dispense Quantity: 90 tablet Refills: 2 Fills remaining: --        Sig: Take 1 tablet (2.5 mg total) by mouth daily     &    2)----  hydrochlorothiazide (HYDRODIURIL) 25 MG tablet [[id number]][[id number]]   Order Details  Dose: 25 mg Route: Oral Frequency: Daily  Dispense Quantity: 90 tablet Refills: 2 Fills remaining: --        Sig: Take 1 tablet (25 mg total) by mouth daily. NEEDS APPT 08/2016          Please send to:   [facility] [place] [address] [phone] (Phone) [phone] (Fax)    ---Forwarding request to medical assistant to review & decide if pt needs OV for refill.  --glh

## 2017-06-15 ENCOUNTER — Telehealth: Payer: Self-pay | Admitting: Adult Health

## 2017-06-15 NOTE — Telephone Encounter (Signed)
Patient called for Rx refill , provider advised OV is required in order to fill Rx request--- cld patient left message to contact the office for appointment set up.  --glh

## 2017-06-16 NOTE — Telephone Encounter (Signed)
Noted MPulliam, CMA/RT(R)  

## 2017-06-20 ENCOUNTER — Encounter: Payer: Self-pay | Admitting: Gastroenterology

## 2017-06-22 ENCOUNTER — Ambulatory Visit: Payer: Medicare Other | Admitting: Adult Health

## 2017-06-28 ENCOUNTER — Ambulatory Visit (INDEPENDENT_AMBULATORY_CARE_PROVIDER_SITE_OTHER): Payer: Medicare Other | Admitting: Adult Health

## 2017-06-28 ENCOUNTER — Encounter: Payer: Self-pay | Admitting: Adult Health

## 2017-06-28 VITALS — BP 124/75 | HR 74 | Ht 65.0 in | Wt 130.2 lb

## 2017-06-28 DIAGNOSIS — M810 Age-related osteoporosis without current pathological fracture: Secondary | ICD-10-CM | POA: Diagnosis not present

## 2017-06-28 DIAGNOSIS — Z0000 Encounter for general adult medical examination without abnormal findings: Secondary | ICD-10-CM | POA: Diagnosis not present

## 2017-06-28 DIAGNOSIS — I10 Essential (primary) hypertension: Secondary | ICD-10-CM

## 2017-06-28 MED ORDER — HYDROCHLOROTHIAZIDE 25 MG PO TABS: 25.0000 mg | ORAL_TABLET | Freq: Every day | ORAL | 2 refills | Status: DC

## 2017-06-28 MED ORDER — ENALAPRIL MALEATE 2.5 MG PO TABS: 2.5000 mg | ORAL_TABLET | Freq: Every day | ORAL | 2 refills | Status: DC

## 2017-06-28 NOTE — Assessment & Plan Note (Signed)
Walks >2 miles day Denosumab 60mg /ml inj Q6M

## 2017-06-28 NOTE — Assessment & Plan Note (Signed)
You are doing a GREAT JOB taking care of yourself! Continue healthy eating, regular walking, and current medications. Recommend follow- up in 6 months- fasting labs and Welcome to Medicare Visit.

## 2017-06-28 NOTE — Assessment & Plan Note (Signed)
BP at goal 124/75, HR 74 Continue enalapril 2.5mg  QD and HCTZ 25mg  QD Refills sent in today

## 2017-06-28 NOTE — Patient Instructions (Signed)
Hypertension Hypertension, commonly called high blood pressure, is when the force of blood pumping through the arteries is too strong. The arteries are the blood vessels that carry blood from the heart throughout the body. Hypertension forces the heart to work harder to pump blood and may cause arteries to become narrow or stiff. Having untreated or uncontrolled hypertension can cause heart attacks, strokes, kidney disease, and other problems. A blood pressure reading consists of a higher number over a lower number. Ideally, your blood pressure should be below 120/80. The first ("top") number is called the systolic pressure. It is a measure of the pressure in your arteries as your heart beats. The second ("bottom") number is called the diastolic pressure. It is a measure of the pressure in your arteries as the heart relaxes. What are the causes? The cause of this condition is not known. What increases the risk? Some risk factors for high blood pressure are under your control. Others are not. Factors you can change  Smoking.  Having type 2 diabetes mellitus, high cholesterol, or both.  Not getting enough exercise or physical activity.  Being overweight.  Having too much fat, sugar, calories, or salt (sodium) in your diet.  Drinking too much alcohol. Factors that are difficult or impossible to change  Having chronic kidney disease.  Having a family history of high blood pressure.  Age. Risk increases with age.  Race. You may be at higher risk if you are African-American.  Gender. Men are at higher risk than women before age 45. After age 65, women are at higher risk than men.  Having obstructive sleep apnea.  Stress. What are the signs or symptoms? Extremely high blood pressure (hypertensive crisis) may cause:  Headache.  Anxiety.  Shortness of breath.  Nosebleed.  Nausea and vomiting.  Severe chest pain.  Jerky movements you cannot control (seizures).  How is this  diagnosed? This condition is diagnosed by measuring your blood pressure while you are seated, with your arm resting on a surface. The cuff of the blood pressure monitor will be placed directly against the skin of your upper arm at the level of your heart. It should be measured at least twice using the same arm. Certain conditions can cause a difference in blood pressure between your right and left arms. Certain factors can cause blood pressure readings to be lower or higher than normal (elevated) for a short period of time:  When your blood pressure is higher when you are in a health care provider's office than when you are at home, this is called white coat hypertension. Most people with this condition do not need medicines.  When your blood pressure is higher at home than when you are in a health care provider's office, this is called masked hypertension. Most people with this condition may need medicines to control blood pressure.  If you have a high blood pressure reading during one visit or you have normal blood pressure with other risk factors:  You may be asked to return on a different day to have your blood pressure checked again.  You may be asked to monitor your blood pressure at home for 1 week or longer.  If you are diagnosed with hypertension, you may have other blood or imaging tests to help your health care provider understand your overall risk for other conditions. How is this treated? This condition is treated by making healthy lifestyle changes, such as eating healthy foods, exercising more, and reducing your alcohol intake. Your   health care provider may prescribe medicine if lifestyle changes are not enough to get your blood pressure under control, and if:  Your systolic blood pressure is above 130.  Your diastolic blood pressure is above 80.  Your personal target blood pressure may vary depending on your medical conditions, your age, and other factors. Follow these  instructions at home: Eating and drinking  Eat a diet that is high in fiber and potassium, and low in sodium, added sugar, and fat. An example eating plan is called the DASH (Dietary Approaches to Stop Hypertension) diet. To eat this way: ? Eat plenty of fresh fruits and vegetables. Try to fill half of your plate at each meal with fruits and vegetables. ? Eat whole grains, such as whole wheat pasta, brown rice, or whole grain bread. Fill about one quarter of your plate with whole grains. ? Eat or drink low-fat dairy products, such as skim milk or low-fat yogurt. ? Avoid fatty cuts of meat, processed or cured meats, and poultry with skin. Fill about one quarter of your plate with lean proteins, such as fish, chicken without skin, beans, eggs, and tofu. ? Avoid premade and processed foods. These tend to be higher in sodium, added sugar, and fat.  Reduce your daily sodium intake. Most people with hypertension should eat less than 1,500 mg of sodium a day.  Limit alcohol intake to no more than 1 drink a day for nonpregnant women and 2 drinks a day for men. One drink equals 12 oz of beer, 5 oz of wine, or 1 oz of hard liquor. Lifestyle  Work with your health care provider to maintain a healthy body weight or to lose weight. Ask what an ideal weight is for you.  Get at least 30 minutes of exercise that causes your heart to beat faster (aerobic exercise) most days of the week. Activities may include walking, swimming, or biking.  Include exercise to strengthen your muscles (resistance exercise), such as pilates or lifting weights, as part of your weekly exercise routine. Try to do these types of exercises for 30 minutes at least 3 days a week.  Do not use any products that contain nicotine or tobacco, such as cigarettes and e-cigarettes. If you need help quitting, ask your health care provider.  Monitor your blood pressure at home as told by your health care provider.  Keep all follow-up visits as  told by your health care provider. This is important. Medicines  Take over-the-counter and prescription medicines only as told by your health care provider. Follow directions carefully. Blood pressure medicines must be taken as prescribed.  Do not skip doses of blood pressure medicine. Doing this puts you at risk for problems and can make the medicine less effective.  Ask your health care provider about side effects or reactions to medicines that you should watch for. Contact a health care provider if:  You think you are having a reaction to a medicine you are taking.  You have headaches that keep coming back (recurring).  You feel dizzy.  You have swelling in your ankles.  You have trouble with your vision. Get help right away if:  You develop a severe headache or confusion.  You have unusual weakness or numbness.  You feel faint.  You have severe pain in your chest or abdomen.  You vomit repeatedly.  You have trouble breathing. Summary  Hypertension is when the force of blood pumping through your arteries is too strong. If this condition is not   controlled, it may put you at risk for serious complications.  Your personal target blood pressure may vary depending on your medical conditions, your age, and other factors. For most people, a normal blood pressure is less than 120/80.  Hypertension is treated with lifestyle changes, medicines, or a combination of both. Lifestyle changes include weight loss, eating a healthy, low-sodium diet, exercising more, and limiting alcohol. This information is not intended to replace advice given to you by your health care provider. Make sure you discuss any questions you have with your health care provider. Document Released: 12/22/2004 Document Revised: 11/20/2015 Document Reviewed: 11/20/2015 Elsevier Interactive Patient Education  2018 [place] refers to food and lifestyle choices that  are based on the traditions of countries located on the [employer]. This way of eating has been shown to help prevent certain conditions and improve outcomes for people who have chronic diseases, like kidney disease and heart disease. What are tips for following this plan? Lifestyle  Cook and eat meals together with your family, when possible.  Drink enough fluid to keep your urine clear or pale yellow.  Be physically active every day. This includes: ? Aerobic exercise like running or swimming. ? Leisure activities like gardening, walking, or housework.  Get 7-8 hours of sleep each night.  If recommended by your health care provider, drink red wine in moderation. This means 1 glass a day for nonpregnant women and 2 glasses a day for men. A glass of wine equals 5 oz (150 mL). Reading food labels  Check the serving size of packaged foods. For foods such as rice and pasta, the serving size refers to the amount of cooked product, not dry.  Check the total fat in packaged foods. Avoid foods that have saturated fat or trans fats.  Check the ingredients list for added sugars, such as corn syrup. Shopping  At the grocery store, buy most of your food from the areas near the walls of the store. This includes: ? Fresh fruits and vegetables (produce). ? Grains, beans, nuts, and seeds. Some of these may be available in unpackaged forms or large amounts (in bulk). ? Fresh seafood. ? Poultry and eggs. ? Low-fat dairy products.  Buy whole ingredients instead of prepackaged foods.  Buy fresh fruits and vegetables in-season from local farmers markets.  Buy frozen fruits and vegetables in resealable bags.  If you do not have access to quality fresh seafood, buy precooked frozen shrimp or canned fish, such as tuna, salmon, or sardines.  Buy small amounts of raw or cooked vegetables, salads, or olives from the deli or salad bar at your store.  Stock your pantry so you always have certain  foods on hand, such as olive oil, canned tuna, canned tomatoes, rice, pasta, and beans. Cooking  Cook foods with extra-virgin olive oil instead of using butter or other vegetable oils.  Have meat as a side dish, and have vegetables or grains as your main dish. This means having meat in small portions or adding small amounts of meat to foods like pasta or stew.  Use beans or vegetables instead of meat in common dishes like chili or lasagna.  Experiment with different cooking methods. Try roasting or broiling vegetables instead of steaming or sauteing them.  Add frozen vegetables to soups, stews, pasta, or rice.  Add nuts or seeds for added healthy fat at each meal. You can add these to yogurt, salads, or vegetable dishes.  Marinate  fish or vegetables using olive oil, lemon juice, garlic, and fresh herbs. Meal planning  Plan to eat 1 vegetarian meal one day each week. Try to work up to 2 vegetarian meals, if possible.  Eat seafood 2 or more times a week.  Have healthy snacks readily available, such as: ? Vegetable sticks with hummus. ? [place] yogurt. ? Fruit and nut trail mix.  Eat balanced meals throughout the week. This includes: ? Fruit: 2-3 servings a day ? Vegetables: 4-5 servings a day ? Low-fat dairy: 2 servings a day ? Fish, poultry, or lean meat: 1 serving a day ? Beans and legumes: 2 or more servings a week ? Nuts and seeds: 1-2 servings a day ? Whole grains: 6-8 servings a day ? Extra-virgin olive oil: 3-4 servings a day  Limit red meat and sweets to only a few servings a month What are my food choices?  Mediterranean diet ? Recommended ? Grains: Whole-grain pasta. Brown rice. Bulgar wheat. Polenta. Couscous. Whole-wheat bread. [place] Cobbatmeal. Quinoa. ? Vegetables: Artichokes. Beets. Broccoli. Cabbage. Carrots. Eggplant. Green beans. Chard. Kale. Spinach. Onions. Leeks. Peas. Squash. Tomatoes. Peppers. Radishes. ? Fruits: Apples. Apricots. Avocado. Berries. Bananas.  Cherries. Dates. Figs. Grapes. Lemons. Melon. Oranges. Peaches. Plums. Pomegranate. ? Meats and other protein foods: Beans. Almonds. Sunflower seeds. Pine nuts. Peanuts. Cod. Salmon. Scallops. Shrimp. Tuna. Tilapia. Clams. Oysters. Eggs. ? Dairy: Low-fat milk. Cheese. Greek yogurt. ? Beverages: Water. Red wine. Herbal tea. ? Fats and oils: Extra virgin olive oil. Avocado oil. Grape seed oil. ? Sweets and desserts: [place] yogurt with honey. Baked apples. Poached pears. Trail mix. ? Seasoning and other foods: Basil. Cilantro. Coriander. Cumin. Mint. Parsley. Sage. Rosemary. Tarragon. Garlic. Oregano. Thyme. Pepper. Balsalmic vinegar. Tahini. Hummus. Tomato sauce. Olives. Mushrooms. ? Limit these ? Grains: Prepackaged pasta or rice dishes. Prepackaged cereal with added sugar. ? Vegetables: Deep fried potatoes (french fries). ? Fruits: Fruit canned in syrup. ? Meats and other protein foods: Beef. Pork. Lamb. Poultry with skin. Hot dogs. Tomasa BlaseBacon. ? Dairy: Ice cream. Sour cream. Whole milk. ? Beverages: Juice. Sugar-sweetened soft drinks. Beer. Liquor and spirits. ? Fats and oils: Butter. Canola oil. Vegetable oil. Beef fat (tallow). Lard. ? Sweets and desserts: Cookies. Cakes. Pies. Candy. ? Seasoning and other foods: Mayonnaise. Premade sauces and marinades. ? The items listed may not be a complete list. Talk with your dietitian about what dietary choices are right for you. Summary  The Mediterranean diet includes both food and lifestyle choices.  Eat a variety of fresh fruits and vegetables, beans, nuts, seeds, and whole grains.  Limit the amount of red meat and sweets that you eat.  Talk with your health care provider about whether it is safe for you to drink red wine in moderation. This means 1 glass a day for nonpregnant women and 2 glasses a day for men. A glass of wine equals 5 oz (150 mL). This information is not intended to replace advice given to you by your health care provider. Make  sure you discuss any questions you have with your health care provider. Document Released: 08/15/2015 Document Revised: 09/17/2015 Document Reviewed: 08/15/2015 Elsevier Interactive Patient Education  2018 [employer]   You are doing a GREAT JOB taking care of yourself! Continue healthy eating, regular walking, and current medications. Recommend follow- up in 6 months- fasting labs and Welcome to Medicare Visit. NICE TO SEE YOU!

## 2017-06-28 NOTE — Progress Notes (Signed)
Subjective:    Patient ID: Lisa Sloan, female    DOB: 20-May-1950, 67 y.o.   MRN: [id number]  HPI:  Lisa Sloan is here for regular f/u: HTN, osteoporosis (treated with denosumab 60mg /ml injection therapy Q6M) , and eczema She reports medication compliance, denies SE She continues to walk >25miles/day and will start a once weekly adult tap class. She has lost >6 lbs since last OV 10/2016 She has reduced CHO/sugar intake and increased water drinking. She denies acute complaints today  Patient Care Team    Relationship Specialty Notifications Start End  William Hamburger D, NP PCP - General Family Medicine  09/16/16   Melina Fiddler, MD Consulting Physician Sports Medicine  09/16/16   Davina Poke  Optometry  09/16/16     Patient Active Problem List   Diagnosis Date Noted  . Healthcare maintenance 09/15/2016  . Osteoporosis 08/04/2015  . Eczema 08/04/2015  . Essential hypertension 01/09/2014     Past Medical History:  Diagnosis Date  . Anemia   . Blood transfusion without reported diagnosis   . Hypertension   . Orthostatic dizziness   . Osteoporosis      Past Surgical History:  Procedure Laterality Date  . CESAREAN SECTION    . FRACTURE SURGERY     ankle and wrist  . HALLUX VALGUS CORRECTION       Family History  Problem Relation Age of Onset  . Diabetes Mother   . Heart disease Mother        diabetic induced  . Hypertension Father   . Cancer Father 61       colon and skin  . Dementia Father   . Heart disease Father   . Kidney failure Father   . Colitis Father   . Diabetes Maternal Grandmother   . Esophageal cancer Neg Hx   . Liver cancer Neg Hx   . Pancreatic cancer Neg Hx   . Stomach cancer Neg Hx   . Rectal cancer Neg Hx      Social History   Substance and Sexual Activity  Drug Use No     Social History   Substance and Sexual Activity  Alcohol Use Yes  . Alcohol/week: 14.4 oz  . Types: 14 Glasses of wine, 10 Standard drinks or  equivalent per week   Comment: wine     Social History   Tobacco Use  Smoking Status Former Smoker  . Packs/day: 1.00  . Years: 5.00  . Pack years: 5.00  . Types: Cigarettes  . Last attempt to quit: 01/06/1983  . Years since quitting: 34.4  Smokeless Tobacco Never Used     Outpatient Encounter Medications as of 06/28/2017  Medication Sig  . denosumab (PROLIA) 60 MG/ML SOLN injection Inject 60 mg into the skin every 6 (six) months. Administer in upper arm, thigh, or abdomen  . desonide (DESOWEN) 0.05 % cream Apply topically 2 (two) times daily. For maximum 10 days. (Patient taking differently: Apply 1 application topically 2 (two) times daily as needed (eczema.). For maximum 10 days.)  . enalapril (VASOTEC) 2.5 MG tablet Take 1 tablet (2.5 mg total) by mouth daily. OFFICE VISIT REQUIRED PRIOR TO ANY FURTHER REFILLS  . hydrochlorothiazide (HYDRODIURIL) 25 MG tablet Take 1 tablet (25 mg total) by mouth daily. OFFICE VISIT REQUIRED PRIOR TO ANY FURTHER REFILLS  . Multiple Vitamin (MULTIVITAMIN WITH MINERALS) TABS tablet Take 1 tablet by mouth daily.   Facility-Administered Encounter Medications as of 06/28/2017  Medication  .  0.9 %  sodium chloride infusion    Allergies: Patient has no known allergies.  Body mass index is 21.67 kg/m.  Blood pressure 124/75, pulse 74, height 5\' 5"  (1.651 m), weight 130 lb 3.2 oz (59.1 kg), SpO2 97 %.  Review of Systems  Constitutional: Negative for activity change, appetite change, chills, diaphoresis, fatigue, fever and unexpected weight change.  HENT: Negative for congestion.   Eyes: Negative for visual disturbance.  Respiratory: Negative for cough, chest tightness, shortness of breath, wheezing and stridor.   Cardiovascular: Negative for chest pain, palpitations and leg swelling.  Gastrointestinal: Negative for abdominal distention, abdominal pain, blood in stool, constipation, diarrhea, nausea and vomiting.  Endocrine: Negative for cold  intolerance, heat intolerance, polydipsia, polyphagia and polyuria.  Genitourinary: Negative for difficulty urinating and flank pain.  Musculoskeletal: Negative for arthralgias, back pain, gait problem, joint swelling, myalgias, neck pain and neck stiffness.  Skin: Negative for color change, pallor, rash and wound.  Neurological: Negative for dizziness and headaches.  Hematological: Does not bruise/bleed easily.  Psychiatric/Behavioral: Negative for confusion, decreased concentration, dysphoric mood, hallucinations, self-injury, sleep disturbance and suicidal ideas. The patient is not nervous/anxious and is not hyperactive.        Objective:   Physical Exam  Constitutional: She is oriented to person, place, and time. She appears well-developed and well-nourished. No distress.  HENT:  Head: Normocephalic and atraumatic.  Right Ear: External ear normal.  Left Ear: External ear normal.  Eyes: Pupils are equal, round, and reactive to light. Conjunctivae and EOM are normal.  Cardiovascular: Normal rate, regular rhythm, normal heart sounds and intact distal pulses.  No murmur heard. Pulmonary/Chest: Effort normal and breath sounds normal. No stridor. No respiratory distress. She has no wheezes. She has no rales. She exhibits no tenderness.  Neurological: She is alert and oriented to person, place, and time.  Skin: Skin is warm and dry. Capillary refill takes less than 2 seconds. No rash noted. She is not diaphoretic. No erythema. No pallor.  Psychiatric: She has a normal mood and affect. Her behavior is normal. Judgment and thought content normal.  Very animated personality, however this is her baseline  Nursing note and vitals reviewed.         Assessment & Plan:   1. Essential hypertension   2. Healthcare maintenance   3. Osteoporosis, unspecified osteoporosis type, unspecified pathological fracture presence     Essential hypertension BP at goal 124/75, HR 74 Continue enalapril  2.5mg  QD and HCTZ 25mg  QD Refills sent in today   Healthcare maintenance You are doing a GREAT JOB taking care of yourself! Continue healthy eating, regular walking, and current medications. Recommend follow- up in 6 months- fasting labs and Welcome to Medicare Visit.  Osteoporosis Walks >2 miles day Denosumab 60mg /ml inj Q6M    FOLLOW-UP:  Return in about 6 months (around 12/28/2017) for Fasting Labs, Medical Wellness.

## 2017-10-01 DIAGNOSIS — M810 Age-related osteoporosis without current pathological fracture: Secondary | ICD-10-CM | POA: Diagnosis not present

## 2017-10-01 DIAGNOSIS — R5383 Other fatigue: Secondary | ICD-10-CM | POA: Diagnosis not present

## 2017-10-01 DIAGNOSIS — E559 Vitamin D deficiency, unspecified: Secondary | ICD-10-CM | POA: Diagnosis not present

## 2017-10-06 DIAGNOSIS — M810 Age-related osteoporosis without current pathological fracture: Secondary | ICD-10-CM | POA: Diagnosis not present

## 2017-10-06 DIAGNOSIS — E559 Vitamin D deficiency, unspecified: Secondary | ICD-10-CM | POA: Diagnosis not present

## 2017-12-20 ENCOUNTER — Encounter: Payer: Self-pay | Admitting: Adult Health

## 2017-12-20 ENCOUNTER — Ambulatory Visit (INDEPENDENT_AMBULATORY_CARE_PROVIDER_SITE_OTHER): Payer: Medicare Other | Admitting: Adult Health

## 2017-12-20 VITALS — BP 126/76 | HR 79 | Temp 97.7°F | Ht 65.5 in | Wt 140.4 lb

## 2017-12-20 DIAGNOSIS — Z1239 Encounter for other screening for malignant neoplasm of breast: Secondary | ICD-10-CM

## 2017-12-20 DIAGNOSIS — Z23 Encounter for immunization: Secondary | ICD-10-CM | POA: Diagnosis not present

## 2017-12-20 DIAGNOSIS — R7309 Other abnormal glucose: Secondary | ICD-10-CM | POA: Diagnosis not present

## 2017-12-20 DIAGNOSIS — Z833 Family history of diabetes mellitus: Secondary | ICD-10-CM | POA: Diagnosis not present

## 2017-12-20 DIAGNOSIS — Z0000 Encounter for general adult medical examination without abnormal findings: Secondary | ICD-10-CM

## 2017-12-20 DIAGNOSIS — I10 Essential (primary) hypertension: Secondary | ICD-10-CM | POA: Diagnosis not present

## 2017-12-20 DIAGNOSIS — L309 Dermatitis, unspecified: Secondary | ICD-10-CM

## 2017-12-20 MED ORDER — DESONIDE 0.05 % EX CREA: 1.0000 "application " | TOPICAL_CREAM | Freq: Two times a day (BID) | CUTANEOUS | 1 refills | Status: AC | PRN

## 2017-12-20 MED ORDER — ZOSTER VAC RECOMB ADJUVANTED 50 MCG/0.5ML IM SUSR: 0.5000 mL | Freq: Once | INTRAMUSCULAR | 0 refills | Status: AC

## 2017-12-20 NOTE — Progress Notes (Signed)
Subjective:   Lisa Sloan is a 67 y.o. female who presents for Medicare Annual (Subsequent) preventive examination.  Review of Systems: General:   Denies fever, chills, unexplained weight loss.  Optho/Auditory:   Denies visual changes, blurred vision/LOV Respiratory:   Denies SOB, DOE more than baseline levels.  Cardiovascular:   Denies chest pain, palpitations, new onset peripheral edema  Gastrointestinal:   Denies nausea, vomiting, diarrhea.  Genitourinary: Denies dysuria, freq/ urgency, flank pain or discharge from genitals.  Endocrine:     Denies hot or cold intolerance, polyuria, polydipsia. Musculoskeletal:   Denies unexplained myalgias, joint swelling, unexplained arthralgias, gait problems.  Skin:  Denies rash, suspicious lesions Neurological:     Denies dizziness, unexplained weakness, numbness  Psychiatric/Behavioral:   Denies mood changes, suicidal or homicidal ideations, hallucinations  Objective:     Vitals: BP 126/76   Pulse 79   Temp 97.7 F (36.5 C) (Oral)   Ht 5' 5.5" (1.664 m)   Wt 140 lb 6.4 oz (63.7 kg)   SpO2 100% Comment: on RA  BMI 23.01 kg/m   Body mass index is 23.01 kg/m.  Advanced Directives 09/15/2016 08/12/2015  Does Patient Have a Medical Advance Directive? No No  Would patient like information on creating a medical advance directive? No - Patient declined Yes - Transport planner given    Tobacco Social History   Tobacco Use  Smoking Status Former Smoker  . Packs/day: 1.00  . Years: 5.00  . Pack years: 5.00  . Types: Cigarettes  . Last attempt to quit: 01/06/1983  . Years since quitting: 34.9  Smokeless Tobacco Never Used     Counseling given: Not Answered   Past Medical History:  Diagnosis Date  . Anemia   . Blood transfusion without reported diagnosis   . Hypertension   . Orthostatic dizziness   . Osteoporosis    Past Surgical History:  Procedure Laterality Date  . CESAREAN SECTION    . FRACTURE SURGERY     ankle  and wrist  . HALLUX VALGUS CORRECTION     Family History  Problem Relation Age of Onset  . Diabetes Mother   . Heart disease Mother        diabetic induced  . Hypertension Father   . Cancer Father 37       colon and skin  . Dementia Father   . Heart disease Father   . Kidney failure Father   . Colitis Father   . Diabetes Maternal Grandmother   . Esophageal cancer Neg Hx   . Liver cancer Neg Hx   . Pancreatic cancer Neg Hx   . Stomach cancer Neg Hx   . Rectal cancer Neg Hx    Social History   Socioeconomic History  . Marital status: Married    Spouse name: Not on file  . Number of children: Not on file  . Years of education: Not on file  . Highest education level: Not on file  Occupational History  . Not on file  Social Needs  . Financial resource strain: Not on file  . Food insecurity:    Worry: Not on file    Inability: Not on file  . Transportation needs:    Medical: Not on file    Non-medical: Not on file  Tobacco Use  . Smoking status: Former Smoker    Packs/day: 1.00    Years: 5.00    Pack years: 5.00    Types: Cigarettes    Last attempt  to quit: 01/06/1983    Years since quitting: 34.9  . Smokeless tobacco: Never Used  Substance and Sexual Activity  . Alcohol use: Yes    Alcohol/week: 24.0 standard drinks    Types: 14 Glasses of wine, 10 Standard drinks or equivalent per week    Comment: wine  . Drug use: No  . Sexual activity: Yes  Lifestyle  . Physical activity:    Days per week: Not on file    Minutes per session: Not on file  . Stress: Not on file  Relationships  . Social connections:    Talks on phone: Not on file    Gets together: Not on file    Attends religious service: Not on file    Active member of club or organization: Not on file    Attends meetings of clubs or organizations: Not on file    Relationship status: Not on file  Other Topics Concern  . 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 denosumab (PROLIA) 60 MG/ML SOLN injection Inject 60 mg into the skin every 6 (six) months. Administer in upper arm, thigh, or abdomen  . desonide (DESOWEN) 0.05 % cream Apply 1 application topically 2 (two) times daily as needed (eczema.). For maximum 10 days.  . enalapril (VASOTEC) 2.5 MG tablet Take 1 tablet (2.5 mg total) by mouth daily. OFFICE VISIT REQUIRED PRIOR TO ANY FURTHER REFILLS  . hydrochlorothiazide (HYDRODIURIL) 25 MG tablet Take 1 tablet (25 mg total) by mouth daily. OFFICE VISIT REQUIRED PRIOR TO ANY FURTHER REFILLS  . Multiple Vitamin (MULTIVITAMIN WITH MINERALS) TABS tablet Take 1 tablet by mouth daily.  Marland Kitchen Zoster Vaccine Adjuvanted [facility]) injection Inject 0.5 mLs into the muscle once for 1 dose.  . [DISCONTINUED] desonide (DESOWEN) 0.05 % cream Apply topically 2 (two) times daily. For maximum 10 days. (Patient taking differently: Apply 1 application topically 2 (two) times daily as needed (eczema.). For maximum 10 days.)   Facility-Administered Encounter Medications as of 12/20/2017  Medication  . 0.9 %  sodium chloride infusion    Activities of Daily Living In your present state of health, do you have any difficulty performing the following activities: 12/20/2017  Hearing? N  Vision? N  Difficulty concentrating or making decisions? N  Walking or climbing stairs? N  Dressing or bathing? N  Doing errands, shopping? N  Some recent data might be hidden    Patient Care Team: Julaine Fusi, NP as PCP - General (Family Medicine) Melina Fiddler, MD as Consulting Physician (Sports Medicine) Davina Poke [facility])    Assessment:    This is a routine wellness examination for Lisa Sloan.  Exercise Activities and Dietary recommendations   Continue regular exercise (walking, dancing) Increase plain water intake Reduce food portion size  Fall Risk Fall Risk  12/20/2017 06/28/2017 01/28/2016 10/24/2015 08/14/2015  Falls in the past year? 1 Yes No No No  Number falls in past yr: 0 1 - - -  Injury with Fall? 0 No - - -  Is the patient's home free of loose throw rugs in walkways, pet beds, electrical cords, etc?   yes      Grab bars in the bathroom? yes      Handrails on the stairs?   yes      Adequate lighting?   yes  Timed Get Up and Go performed: WNL  Depression Screen PHQ 2/9 Scores 06/28/2017 10/21/2016 09/15/2016 01/28/2016  PHQ - 2 Score 0 0 0 0  PHQ- 9 Score 1 0 0 -     Cognitive Function Normal  Immunization History  Administered Date(s) Administered  . Influenza, High Dose Seasonal PF 10/21/2016  . Influenza,inj,Quad PF,6+ Mos 11/20/2013, 01/09/2015, 10/24/2015  . Pneumococcal Conjugate-13 10/21/2016  . Tdap 01/09/2015    Qualifies for Shingles Vaccine?Yes  Screening Tests Health Maintenance  Topic Date Due  . DEXA SCAN  09/10/2016  . INFLUENZA VACCINE  08/05/2017  . PNA vac Low Risk Adult (2 of 2 - PPSV23) 10/21/2017  . MAMMOGRAM  10/09/2018  . COLONOSCOPY  06/08/2020  . TETANUS/TDAP  01/08/2025  . Hepatitis C Screening  Completed    Cancer Screenings: Lung: Low Dose CT Chest recommended if Age 45-80 years, 30 pack-year currently smoking OR have quit w/in 15years. Patient does not qualify. Breast:  Up to date on Mammogram? No   Up to date of Bone Density/Dexa? Yes Colorectal: UTD, last 06/08/17, repeat in 3 years  Additional Screenings: Hepatitis C Screening: UTD, 10/24/15     Plan:  Continue medications as directed. We will call you when lab results are available. Continue regular exercise (walking, dancing) Increase plain water intake Reduce food portion size Follow-up in 6  months  I have personally reviewed and noted the following in the patient's chart:   . Medical and social history . Use of alcohol, tobacco or illicit drugs  . Current medications and supplements . Functional ability and status . Nutritional status . Physical activity . Advanced directives . List of other physicians . Hospitalizations, surgeries, and ER visits in previous 12 months . Vitals . Screenings to include cognitive, depression, and falls . Referrals and appointments  In addition, I have reviewed and discussed with patient certain preventive protocols, quality metrics, and best practice recommendations. A written personalized care plan for preventive services as well as general preventive health recommendations were provided to patient.     Julaine FusiKaty D Danford, NP  12/20/2017

## 2017-12-20 NOTE — Patient Instructions (Addendum)
  [name] , Thank you for taking time to come for your Medicare Wellness Visit. I appreciate your ongoing commitment to your health goals. Please review the following plan we discussed and let me know if I can assist you in the future.   These are the goals we discussed: Continue medications as directed. We will call you when lab results are available. Continue regular exercise (walking, dancing) Increase plain water intake Reduce food portion size Follow-up in 6 months   This is a list of the screening recommended for you and due dates:  Health Maintenance  Topic Date Due  . DEXA scan (bone density measurement)  09/10/2016  . Flu Shot  08/05/2017  . Pneumonia vaccines (2 of 2 - PPSV23) 10/21/2017  . Mammogram  10/09/2018  . Colon Cancer Screening  06/08/2020  . Tetanus Vaccine  01/08/2025  .  Hepatitis C: One time screening is recommended by Center for Disease Control  (CDC) for  adults born from [id number] through 1964.   Completed

## 2017-12-21 LAB — CBC WITH DIFFERENTIAL/PLATELET
BASOS: 1 %
Basophils Absolute: 0 10*3/uL (ref 0.0–0.2)
EOS (ABSOLUTE): 0.1 10*3/uL (ref 0.0–0.4)
Eos: 2 %
Hematocrit: 41.5 % (ref 34.0–46.6)
Hemoglobin: 14.3 g/dL (ref 11.1–15.9)
Immature Grans (Abs): 0 10*3/uL (ref 0.0–0.1)
Immature Granulocytes: 0 %
Lymphocytes Absolute: 0.9 10*3/uL (ref 0.7–3.1)
Lymphs: 25 %
MCH: 29.9 pg (ref 26.6–33.0)
MCHC: 34.5 g/dL (ref 31.5–35.7)
MCV: 87 fL (ref 79–97)
Monocytes Absolute: 0.4 10*3/uL (ref 0.1–0.9)
Monocytes: 11 %
NEUTROS ABS: 2.1 10*3/uL (ref 1.4–7.0)
Neutrophils: 61 %
Platelets: 182 10*3/uL (ref 150–450)
RBC: 4.78 x10E6/uL (ref 3.77–5.28)
RDW: 13.3 % (ref 12.3–15.4)
WBC: 3.4 10*3/uL (ref 3.4–10.8)

## 2017-12-21 LAB — COMPREHENSIVE METABOLIC PANEL
ALT: 21 IU/L (ref 0–32)
AST: 24 IU/L (ref 0–40)
Albumin/Globulin Ratio: 1.9 (ref 1.2–2.2)
Albumin: 4.2 g/dL (ref 3.6–4.8)
Alkaline Phosphatase: 50 IU/L (ref 39–117)
BUN / CREAT RATIO: 15 (ref 12–28)
BUN: 12 mg/dL (ref 8–27)
Bilirubin Total: 0.5 mg/dL (ref 0.0–1.2)
CO2: 23 mmol/L (ref 20–29)
Calcium: 8.9 mg/dL (ref 8.7–10.3)
Chloride: 102 mmol/L (ref 96–106)
Creatinine, Ser: 0.79 mg/dL (ref 0.57–1.00)
GFR calc non Af Amer: 78 mL/min/{1.73_m2} (ref >59)
GFR, EST AFRICAN AMERICAN: 90 mL/min/{1.73_m2} (ref >59)
Globulin, Total: 2.2 g/dL (ref 1.5–4.5)
Glucose: 90 mg/dL (ref 65–99)
Potassium: 3.8 mmol/L (ref 3.5–5.2)
Sodium: 140 mmol/L (ref 134–144)
Total Protein: 6.4 g/dL (ref 6.0–8.5)

## 2017-12-21 LAB — LIPID PANEL
CHOL/HDL RATIO: 3.1 ratio (ref 0.0–4.4)
Cholesterol, Total: 210 mg/dL — ABNORMAL HIGH (ref 100–199)
HDL: 67 mg/dL (ref >39)
LDL CALC: 118 mg/dL — AB (ref 0–99)
Triglycerides: 125 mg/dL (ref 0–149)
VLDL Cholesterol Cal: 25 mg/dL (ref 5–40)

## 2017-12-21 LAB — TSH: TSH: 1.55 u[IU]/mL (ref 0.450–4.500)

## 2017-12-21 LAB — HEMOGLOBIN A1C
Est. average glucose Bld gHb Est-mCnc: 105 mg/dL
Hgb A1c MFr Bld: 5.3 % (ref 4.8–5.6)

## 2018-01-13 NOTE — Progress Notes (Signed)
Received Epic notification that pt has not read MyChart message regarding results.    This message is to inform you that the patient has not yet read the following message. (Notification date: January 04, 2018)  Recent lab results   From Stan Head, CMA To LAVONDA IMDIEKE Sent 12/21/2017 1:23 PM  Dear Ms. Murrell Converse asked that I inform you that your recent labs showed that your complete blood count, metabolic panel, A1c (3 month average of blood sugars), and thyroid tests were all normal.   Your HDL (good cholesterol) level was 67 (normal is greater than 40), total cholesterol was 210 (normal is less than 199) and LDL (bad cholesterol) was 709 ( normal is less than 99). Guidelines say "may consider" a moderate dose of a statin medication at these levels. Would you like to start atorvastatin 10mg  once nightly or continue to focus on diet/exercise and we will recheck in 6 months at your follow up?   Please let us know what you would like to do.   Wishing you well,  Archie Patten, CMA for  William Hamburger, NP   [address]   MyChart User Last Read On  FERN FLATEN Not Read     Discussed results and recommendations with pt verbally.  Pt expressed understanding and states that she wishes to wait until her f/u appointment and recheck lipids at that time.  Tiajuana Amass, CMA

## 2018-01-28 ENCOUNTER — Ambulatory Visit: Payer: Medicare Other

## 2018-02-18 ENCOUNTER — Ambulatory Visit
Admission: RE | Admit: 2018-02-18 | Discharge: 2018-02-18 | Disposition: A | Discharge status: Home / Self Care | Payer: Medicare Other | Source: Ambulatory Visit | Attending: Adult Health | Admitting: Adult Health

## 2018-02-18 DIAGNOSIS — Z1239 Encounter for other screening for malignant neoplasm of breast: Secondary | ICD-10-CM

## 2018-02-18 DIAGNOSIS — Z1231 Encounter for screening mammogram for malignant neoplasm of breast: Secondary | ICD-10-CM | POA: Diagnosis not present

## 2018-04-13 DIAGNOSIS — M810 Age-related osteoporosis without current pathological fracture: Secondary | ICD-10-CM | POA: Diagnosis not present

## 2018-05-02 ENCOUNTER — Other Ambulatory Visit: Payer: Self-pay | Admitting: Adult Health

## 2018-06-20 NOTE — Progress Notes (Signed)
Virtual Visit via Video Note  I connected with Lisa Sloan on 06/21/2018 at  8:45 AM EDT by a video enabled telemedicine application and verified that I am speaking with the correct person using two identifiers.  Location: Patient: Home Provider: In Clinic   I discussed the limitations of evaluation and management by telemedicine and the availability of in person appointments. The patient expressed understanding and agreed to proceed.  History of Present Illness: 06/29/2018 OV: Lisa Sloan is here for regular f/u: HTN, osteoporosis (treated with denosumab 60mg /ml injection therapy Q6M) , and eczema She reports medication compliance, denies SE She continues to walk >732miles/day and will start a once weekly adult tap class. She has lost >6 lbs since last OV 10/2016 She has reduced CHO/sugar intake and increased water drinking. She denies acute complaints today  12/2017 Medicare Wellness Visit 12/2017 Labs: CBC-WNL  CMP-WNL  A1c-WNL, 5.3  TSH-WNL,1.550  Lipid Panel-  The 10-year ASCVD risk score Denman George(Goff DC Jr., et al., 2013) is: 7.6%  Values used to calculate the score:   Age: [age] years   Sex: Female   Is Non-Hispanic African American: No   Diabetic: No   Tobacco smoker: No   Systolic Blood Pressure: 126 mmHg   Is BP treated: Yes   HDL Cholesterol: 67 mg/dL   Total Cholesterol: 409210 mg/dL  [id number]  Guidelines say "may consider" mod dose statin.  Would she like to start atorvastatin 10mg  once nightly or continue to focus on diet/exercise and we will recheck in 6 months at f/u?   06/21/2018 OV: Lisa Sloan is using WebEx today for regular f/u: She reports drinking >2L water/day She has been following heart healthy diet, primary source of protein is fish She previously declined stating therapy, preferred lifestyle modifications She has been unable to check BP/HR at home due to "running out of batteries for my BP machine". She has been walking 5K  daily- GREAT! 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 She has been consistently socially distancing and wearing a mask when out in public   Patient Care Team    Relationship Specialty Notifications Start End  William Hamburgeranford, Travaughn Vue D, NP PCP - General Family Medicine  09/16/16   Melina FiddlerBassett, Rebecca S, MD Consulting Physician Sports Medicine  09/16/16   Davina Pokeunn, Peter K  Optometry  09/16/16   [facility]  Diagnostic Radiology  12/20/17     Patient Active Problem List   Diagnosis Date Noted  . Healthcare maintenance 09/15/2016  . Osteoporosis 08/04/2015  . Eczema 08/04/2015  . Essential hypertension 01/09/2014     Past Medical History:  Diagnosis Date  . Anemia   . Blood transfusion without reported diagnosis   . Hypertension   . Orthostatic dizziness   . Osteoporosis      Past Surgical History:  Procedure Laterality Date  . CESAREAN SECTION    . FRACTURE SURGERY     ankle and wrist  . HALLUX VALGUS CORRECTION       Family History  Problem Relation Age of Onset  . Diabetes Mother   . Heart disease Mother        diabetic induced  . Hypertension Father   . Cancer Father 1490       colon and skin  . Dementia Father   . Heart disease Father   . Kidney failure Father   . Colitis Father   . Diabetes Maternal  Grandmother   . Esophageal cancer Neg Hx   . Liver cancer Neg Hx   . Pancreatic cancer Neg Hx   . Stomach cancer Neg Hx   . Rectal cancer Neg Hx   . Breast cancer Neg Hx      Social History   Substance and Sexual Activity  Drug Use No     Social History   Substance and Sexual Activity  Alcohol Use Yes  . Alcohol/week: 24.0 standard drinks  . Types: 14 Glasses of wine, 10 Standard drinks or equivalent per week   Comment: wine     Social History   Tobacco Use  Smoking Status Former Smoker  . Packs/day: 1.00  . Years: 5.00  . Pack years: 5.00  . Types:  Cigarettes  . Quit date: 01/06/1983  . Years since quitting: 35.4  Smokeless Tobacco Never Used     Outpatient Encounter Medications as of 06/21/2018  Medication Sig  . denosumab (PROLIA) 60 MG/ML SOLN injection Inject 60 mg into the skin every 6 (six) months. Administer in upper arm, thigh, or abdomen  . desonide (DESOWEN) 0.05 % cream Apply 1 application topically 2 (two) times daily as needed (eczema.). For maximum 10 days.  . enalapril (VASOTEC) 2.5 MG tablet TAKE 1 TABLET BY MOUTH DAILY.  . hydrochlorothiazide (HYDRODIURIL) 25 MG tablet TAKE 1 TABLET BY MOUTH DAILY.  . Multiple Vitamin (MULTIVITAMIN WITH MINERALS) TABS tablet Take 1 tablet by mouth daily.   Facility-Administered Encounter Medications as of 06/21/2018  Medication  . 0.9 %  sodium chloride infusion    Allergies: Patient has no known allergies.  Body mass index is 22.63 kg/m.  Temperature (!) 97 F (36.1 C), temperature source Oral, height 5\' 5"  (1.651 m), weight 136 lb (61.7 kg). Review of Systems: General:   Denies fever, chills, unexplained weight loss.  Optho/Auditory:   Denies visual changes, blurred vision/LOV Respiratory:   Denies SOB, DOE more than baseline levels.  Cardiovascular:   Denies chest pain, palpitations, new onset peripheral edema  Gastrointestinal:   Denies nausea, vomiting, diarrhea.  Genitourinary: Denies dysuria, freq/ urgency, flank pain or discharge from genitals.  Endocrine:     Denies hot or cold intolerance, polyuria, polydipsia. Musculoskeletal:   Denies unexplained myalgias, joint swelling, unexplained arthralgias, gait problems.  Skin:  Denies rash, suspicious lesions Neurological:     Denies dizziness, unexplained weakness, numbness  Psychiatric/Behavioral:   Denies mood changes, suicidal or homicidal ideations, hallucinations This patient does not have sx concerning for COVID-19 Infection (ie; fever, chills, cough, new or worsening shortness of  breath).  Observations/Objective: No acute distress noted over the vidoe-feed  Assessment and Plan: Continue all medications as directed. Remain well hydrated, follow Mediterranean Diet. Continue daily 5K walks Get new batteries for BP machine and check BP/HR several times/week, if consistently <100/60 or >140/90 call clinic Continue to social distance and wear a mask when out in public  Follow Up Instructions: 6 months CPE, fasting labs the week prior    I discussed the assessment and treatment plan with the patient. The patient was provided an opportunity to ask questions and all were answered. The patient agreed with the plan and demonstrated an understanding of the instructions.   The patient was advised to call back or seek an in-person evaluation if the symptoms worsen or if the condition fails to improve as anticipated.  I provided 18 minutes of non-face-to-face time during this encounter.   Esaw Grandchild, NP

## 2018-06-21 ENCOUNTER — Ambulatory Visit (INDEPENDENT_AMBULATORY_CARE_PROVIDER_SITE_OTHER): Payer: Medicare Other | Admitting: Adult Health

## 2018-06-21 ENCOUNTER — Encounter: Payer: Self-pay | Admitting: Adult Health

## 2018-06-21 ENCOUNTER — Other Ambulatory Visit: Payer: Self-pay

## 2018-06-21 VITALS — Temp 97.0°F | Ht 65.0 in | Wt 136.0 lb

## 2018-06-21 DIAGNOSIS — Z0000 Encounter for general adult medical examination without abnormal findings: Secondary | ICD-10-CM | POA: Diagnosis not present

## 2018-06-21 DIAGNOSIS — D709 Neutropenia, unspecified: Secondary | ICD-10-CM

## 2018-06-21 DIAGNOSIS — E785 Hyperlipidemia, unspecified: Secondary | ICD-10-CM | POA: Diagnosis not present

## 2018-06-21 DIAGNOSIS — I10 Essential (primary) hypertension: Secondary | ICD-10-CM | POA: Diagnosis not present

## 2018-06-21 NOTE — Assessment & Plan Note (Signed)
Assessment and Plan: Continue all medications as directed. Remain well hydrated, follow Mediterranean Diet. Continue daily 5K walks Get new batteries for BP machine and check BP/HR several times/week, if consistently <100/60 or >140/90 call clinic Continue to social distance and wear a mask when out in public  Follow Up Instructions: 6 months CPE, fasting labs the week prior    I discussed the assessment and treatment plan with the patient. The patient was provided an opportunity to ask questions and all were answered. The patient agreed with the plan and demonstrated an understanding of the instructions.   The patient was advised to call back or seek an in-person evaluation if the sympto

## 2018-06-21 NOTE — Addendum Note (Signed)
Addended by: Fonnie Mu on: 06/21/2018 09:17 AM   Modules accepted: Orders

## 2018-07-30 ENCOUNTER — Other Ambulatory Visit: Payer: Self-pay | Admitting: Adult Health

## 2018-10-14 DIAGNOSIS — E559 Vitamin D deficiency, unspecified: Secondary | ICD-10-CM | POA: Diagnosis not present

## 2018-10-14 DIAGNOSIS — M810 Age-related osteoporosis without current pathological fracture: Secondary | ICD-10-CM | POA: Diagnosis not present

## 2018-10-14 DIAGNOSIS — R5383 Other fatigue: Secondary | ICD-10-CM | POA: Diagnosis not present

## 2018-10-19 DIAGNOSIS — E559 Vitamin D deficiency, unspecified: Secondary | ICD-10-CM | POA: Diagnosis not present

## 2018-10-19 DIAGNOSIS — M810 Age-related osteoporosis without current pathological fracture: Secondary | ICD-10-CM | POA: Diagnosis not present

## 2019-01-27 ENCOUNTER — Other Ambulatory Visit: Payer: Self-pay | Admitting: Adult Health

## 2019-01-27 ENCOUNTER — Telehealth: Payer: Self-pay

## 2019-01-27 DIAGNOSIS — Z1231 Encounter for screening mammogram for malignant neoplasm of breast: Secondary | ICD-10-CM

## 2019-01-27 NOTE — Telephone Encounter (Signed)
Please call pt to schedule appt for CPE.  No further refills until pt is seen.  T. Issiac Jamar, CMA  

## 2019-03-02 ENCOUNTER — Telehealth: Payer: Self-pay

## 2019-03-02 ENCOUNTER — Other Ambulatory Visit: Payer: Self-pay | Admitting: Adult Health

## 2019-03-02 NOTE — Telephone Encounter (Signed)
Please call pt to schedule appt.  No further refills until pt is seen.  T. Caroll Cunnington, CMA  

## 2019-03-07 ENCOUNTER — Ambulatory Visit: Payer: Medicare Other

## 2019-03-18 ENCOUNTER — Other Ambulatory Visit: Payer: Self-pay | Admitting: Family Medicine

## 2019-03-21 ENCOUNTER — Telehealth: Payer: Self-pay | Admitting: Adult Health

## 2019-03-21 NOTE — Telephone Encounter (Signed)
Patient is due for a med f/u for refills, our next available appt was 04/11/19 which the patient did schedule but she will be out of her meds before this available date. She is requesting one more refill to hold her over until her OV since she cannot be seen sooner. She needs her enalapril and hydrochlorothiazide, and if approved please send to [facility]

## 2019-03-21 NOTE — Telephone Encounter (Signed)
LMTCB in regards to below. AS< CMA

## 2019-03-21 NOTE — Telephone Encounter (Signed)
Patient was seen 06/21/18 by Orpha Bur and advised to follow up in 6 months for CPE.   Patient Hydrochlorothiazide 25mg  -last refill given 03/02/19 #15 and Enalapril 2.5 given 03/02/19 #15.   Patient is scheduled to be seen 04/11/19 at 215pm but is requesting medication to get her to this apt.   Please advise. AS, CMA

## 2019-03-21 NOTE — Telephone Encounter (Signed)
I recommend following our clinic's refill policy.

## 2019-03-22 DIAGNOSIS — M8589 Other specified disorders of bone density and structure, multiple sites: Secondary | ICD-10-CM | POA: Diagnosis not present

## 2019-03-22 NOTE — Telephone Encounter (Signed)
Patient is aware of the below. AS, CMA 

## 2019-03-24 DIAGNOSIS — I10 Essential (primary) hypertension: Secondary | ICD-10-CM | POA: Diagnosis not present

## 2019-03-24 DIAGNOSIS — Z79899 Other long term (current) drug therapy: Secondary | ICD-10-CM | POA: Diagnosis not present

## 2019-04-11 ENCOUNTER — Ambulatory Visit: Payer: Medicare Other | Admitting: Family Medicine

## 2019-04-19 DIAGNOSIS — M810 Age-related osteoporosis without current pathological fracture: Secondary | ICD-10-CM | POA: Diagnosis not present

## 2019-04-19 DIAGNOSIS — R5383 Other fatigue: Secondary | ICD-10-CM | POA: Diagnosis not present

## 2019-04-19 DIAGNOSIS — E559 Vitamin D deficiency, unspecified: Secondary | ICD-10-CM | POA: Diagnosis not present

## 2019-04-26 DIAGNOSIS — E559 Vitamin D deficiency, unspecified: Secondary | ICD-10-CM | POA: Diagnosis not present

## 2019-04-26 DIAGNOSIS — M810 Age-related osteoporosis without current pathological fracture: Secondary | ICD-10-CM | POA: Diagnosis not present

## 2019-05-04 ENCOUNTER — Other Ambulatory Visit: Payer: Self-pay

## 2019-05-04 ENCOUNTER — Ambulatory Visit
Admission: RE | Admit: 2019-05-04 | Discharge: 2019-05-04 | Disposition: A | Discharge status: Home / Self Care | Payer: Medicare Other | Source: Ambulatory Visit | Attending: Adult Health | Admitting: Adult Health

## 2019-05-04 DIAGNOSIS — Z1231 Encounter for screening mammogram for malignant neoplasm of breast: Secondary | ICD-10-CM

## 2019-05-15 DIAGNOSIS — Z9181 History of falling: Secondary | ICD-10-CM | POA: Diagnosis not present

## 2019-05-15 DIAGNOSIS — Z1159 Encounter for screening for other viral diseases: Secondary | ICD-10-CM | POA: Diagnosis not present

## 2019-05-15 DIAGNOSIS — M810 Age-related osteoporosis without current pathological fracture: Secondary | ICD-10-CM | POA: Diagnosis not present

## 2019-05-15 DIAGNOSIS — Z139 Encounter for screening, unspecified: Secondary | ICD-10-CM | POA: Diagnosis not present

## 2019-05-15 DIAGNOSIS — E7800 Pure hypercholesterolemia, unspecified: Secondary | ICD-10-CM | POA: Diagnosis not present

## 2019-05-15 DIAGNOSIS — I10 Essential (primary) hypertension: Secondary | ICD-10-CM | POA: Diagnosis not present

## 2019-10-26 DIAGNOSIS — R5383 Other fatigue: Secondary | ICD-10-CM | POA: Diagnosis not present

## 2019-10-26 DIAGNOSIS — M810 Age-related osteoporosis without current pathological fracture: Secondary | ICD-10-CM | POA: Diagnosis not present

## 2019-10-26 DIAGNOSIS — E559 Vitamin D deficiency, unspecified: Secondary | ICD-10-CM | POA: Diagnosis not present

## 2019-10-31 DIAGNOSIS — M810 Age-related osteoporosis without current pathological fracture: Secondary | ICD-10-CM | POA: Diagnosis not present

## 2019-10-31 DIAGNOSIS — E559 Vitamin D deficiency, unspecified: Secondary | ICD-10-CM | POA: Diagnosis not present

## 2019-11-15 DIAGNOSIS — E7800 Pure hypercholesterolemia, unspecified: Secondary | ICD-10-CM | POA: Diagnosis not present

## 2019-11-15 DIAGNOSIS — Z1331 Encounter for screening for depression: Secondary | ICD-10-CM | POA: Diagnosis not present

## 2019-11-15 DIAGNOSIS — Z23 Encounter for immunization: Secondary | ICD-10-CM | POA: Diagnosis not present

## 2019-11-15 DIAGNOSIS — Z9181 History of falling: Secondary | ICD-10-CM | POA: Diagnosis not present

## 2019-11-15 DIAGNOSIS — Z6823 Body mass index (BMI) 23.0-23.9, adult: Secondary | ICD-10-CM | POA: Diagnosis not present

## 2019-11-15 DIAGNOSIS — I10 Essential (primary) hypertension: Secondary | ICD-10-CM | POA: Diagnosis not present

## 2019-11-15 DIAGNOSIS — M810 Age-related osteoporosis without current pathological fracture: Secondary | ICD-10-CM | POA: Diagnosis not present

## 2019-11-16 DIAGNOSIS — Z23 Encounter for immunization: Secondary | ICD-10-CM | POA: Diagnosis not present

## 2020-02-08 DIAGNOSIS — Z6824 Body mass index (BMI) 24.0-24.9, adult: Secondary | ICD-10-CM | POA: Diagnosis not present

## 2020-02-08 DIAGNOSIS — M898X2 Other specified disorders of bone, upper arm: Secondary | ICD-10-CM | POA: Diagnosis not present

## 2020-02-08 DIAGNOSIS — M25512 Pain in left shoulder: Secondary | ICD-10-CM | POA: Diagnosis not present

## 2020-04-15 ENCOUNTER — Other Ambulatory Visit: Payer: Self-pay | Admitting: Family Medicine

## 2020-04-15 DIAGNOSIS — Z1231 Encounter for screening mammogram for malignant neoplasm of breast: Secondary | ICD-10-CM

## 2020-04-24 DIAGNOSIS — M810 Age-related osteoporosis without current pathological fracture: Secondary | ICD-10-CM | POA: Diagnosis not present

## 2020-04-24 DIAGNOSIS — E559 Vitamin D deficiency, unspecified: Secondary | ICD-10-CM | POA: Diagnosis not present

## 2020-05-01 DIAGNOSIS — M810 Age-related osteoporosis without current pathological fracture: Secondary | ICD-10-CM | POA: Diagnosis not present

## 2020-05-01 DIAGNOSIS — M7542 Impingement syndrome of left shoulder: Secondary | ICD-10-CM | POA: Diagnosis not present

## 2020-05-05 IMAGING — MG DIGITAL SCREENING BILAT W/ TOMO W/ CAD
6 of 10 series · 6 of 30 positions shown · non-contrast
Comparison: Previous exam(s).

CLINICAL DATA: Screening.

EXAM:
DIGITAL SCREENING BILATERAL MAMMOGRAM WITH TOMO AND CAD

[R MLO synth-2D (1 of 2)]
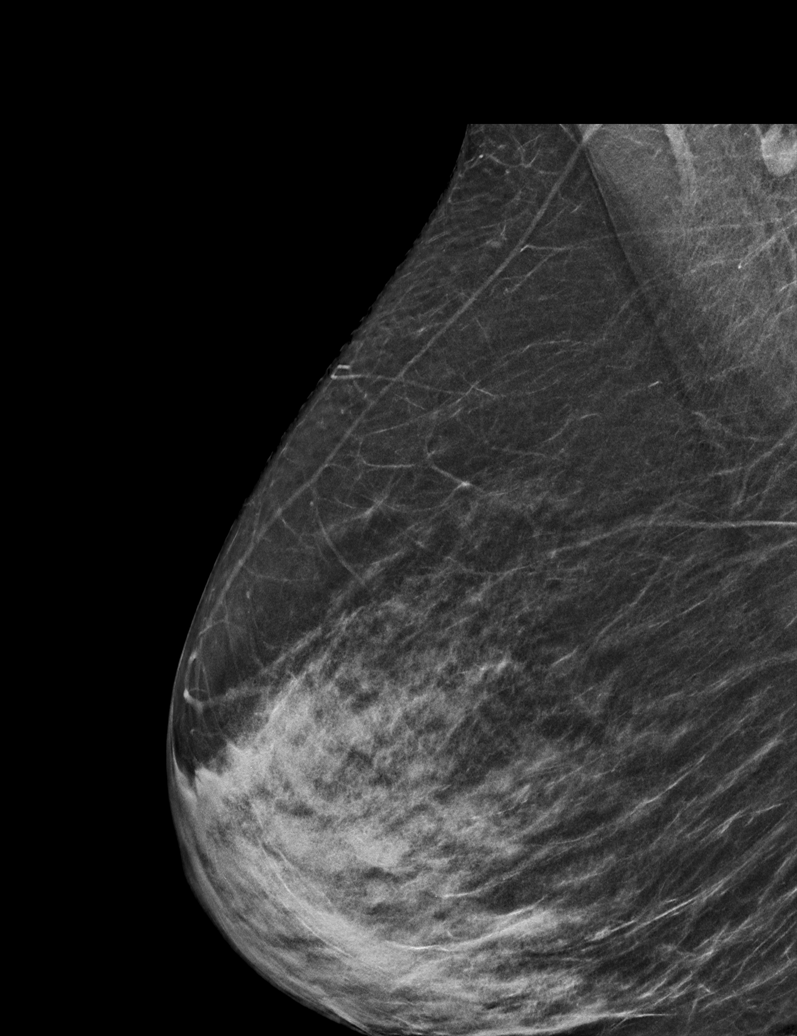

[R MLO synth-2D (2 of 2)]
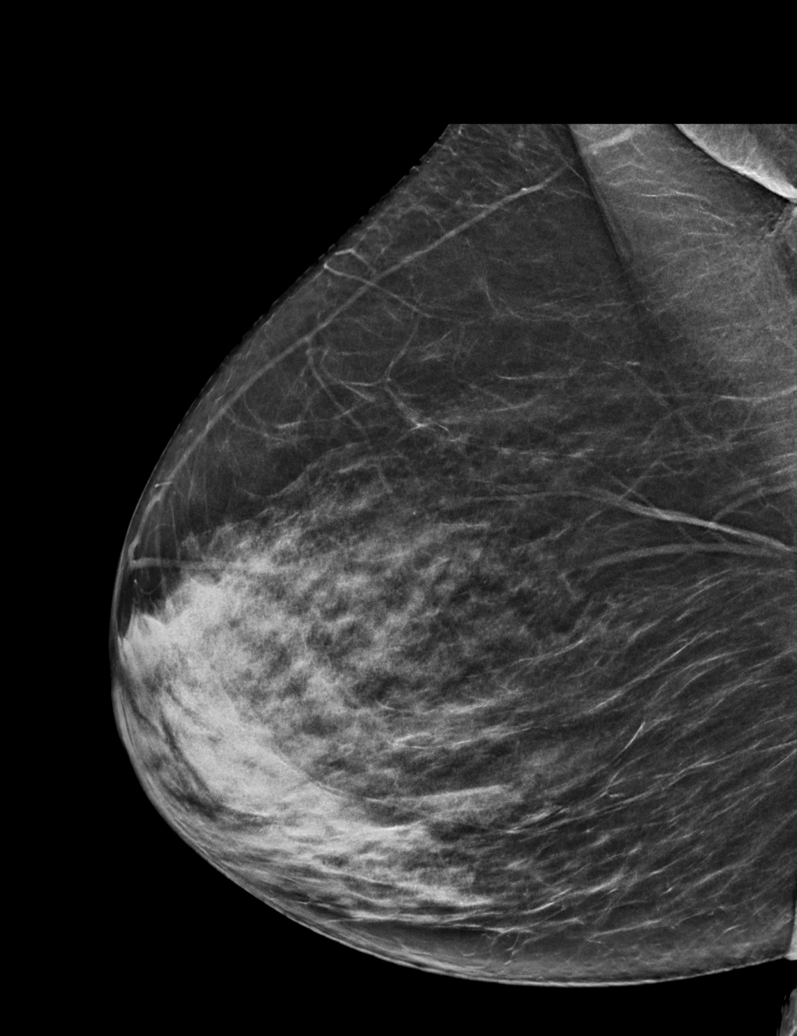

[L MLO synth-2D]
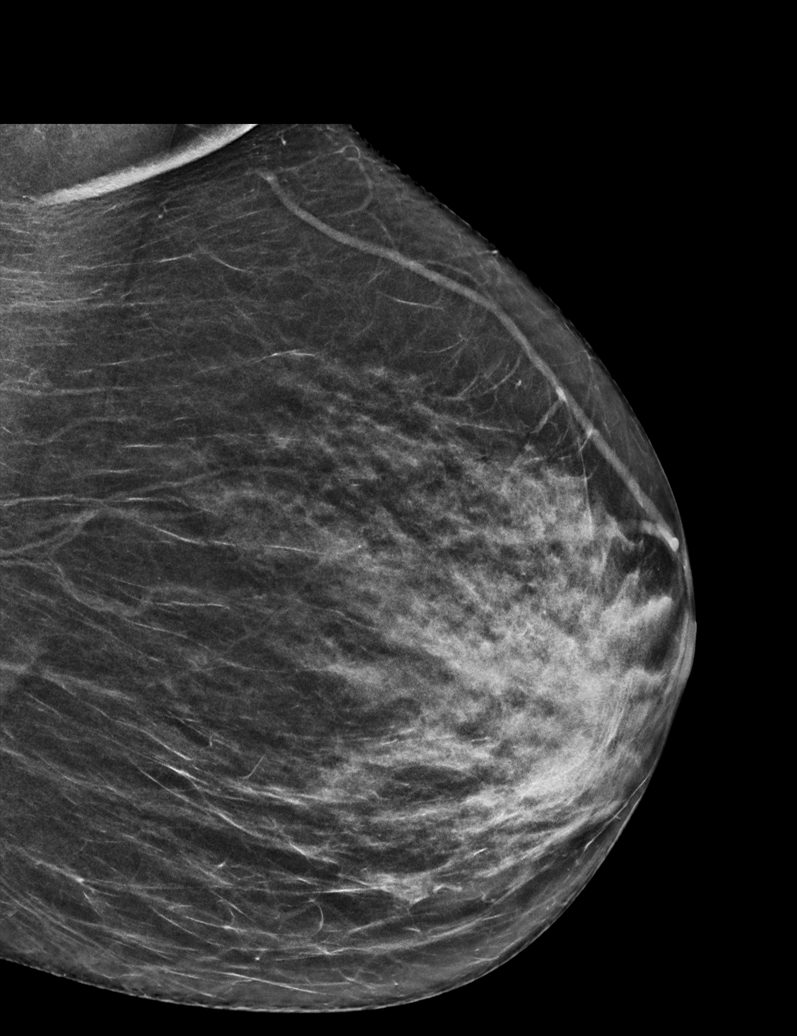

[L CC synth-2D]
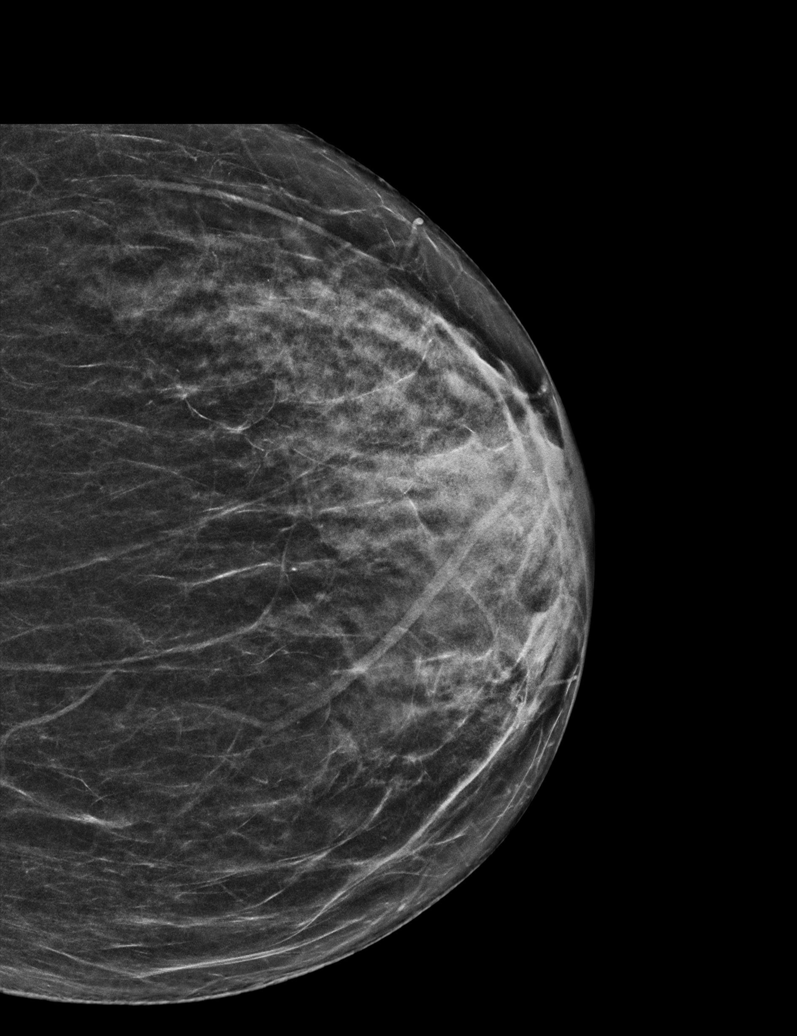

[R CC synth-2D]
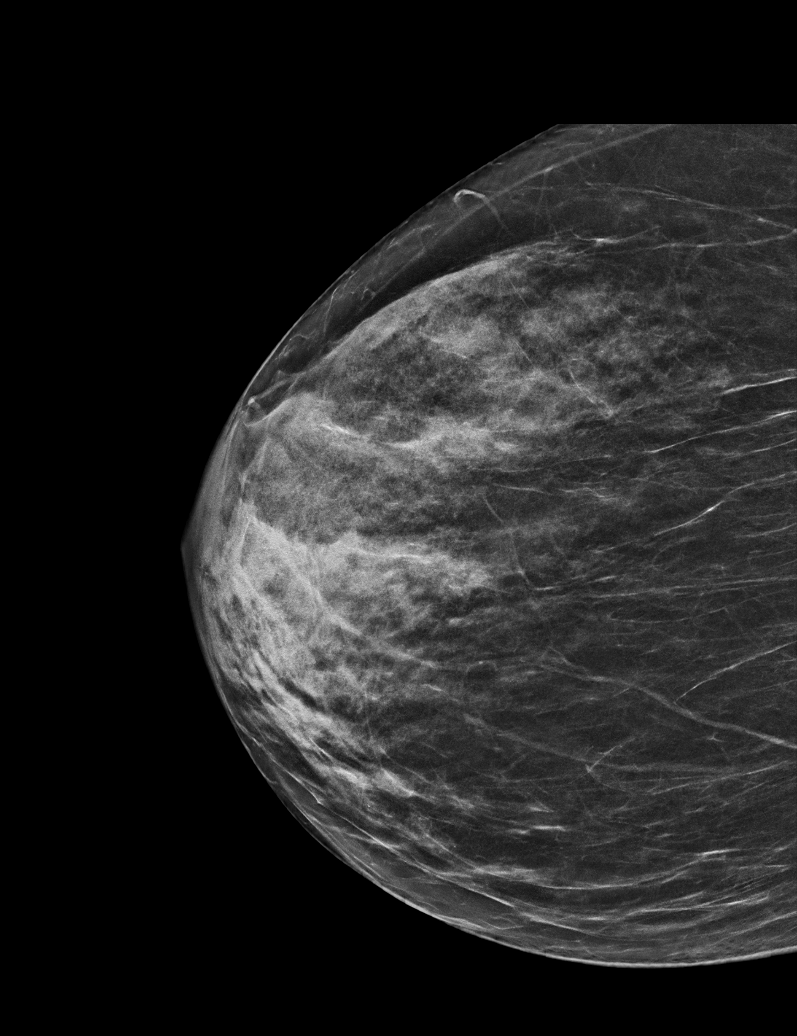

[L CC tomo · tomo slice 25/48.0]
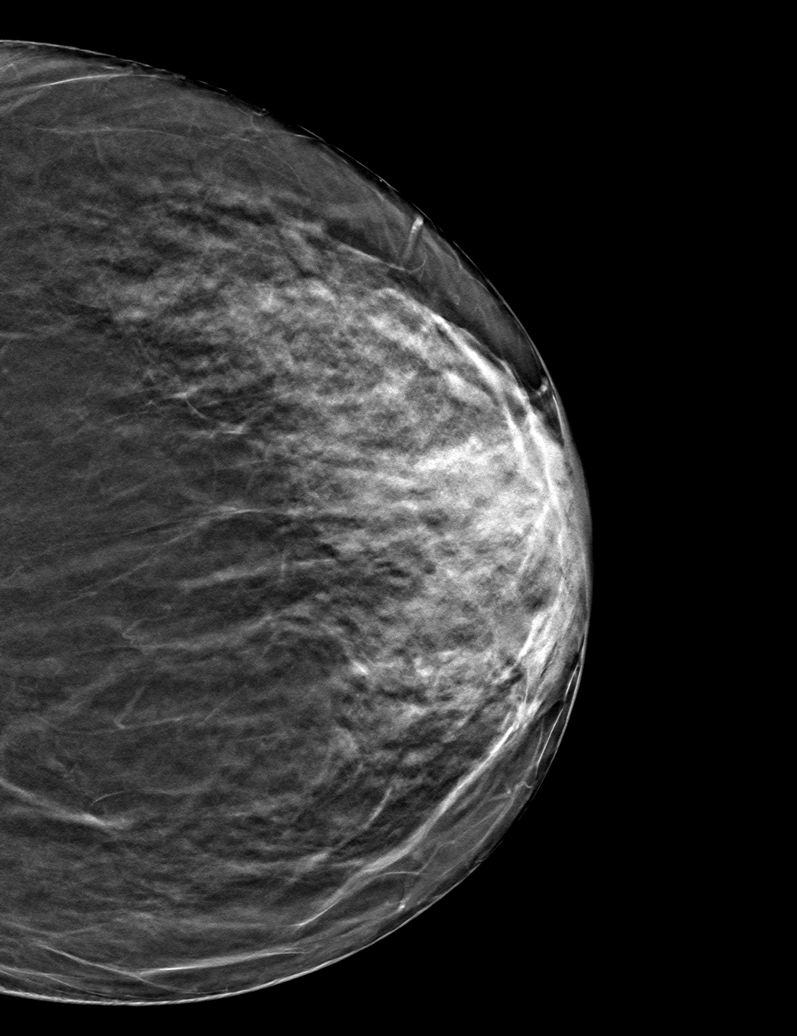

[6 of 30 positions shown; findings below may reference images not displayed]

ACR Breast Density Category c: The breast tissue is heterogeneously
dense, which may obscure small masses.
FINDINGS: There are no findings suspicious for malignancy. Images were
processed with CAD.
IMPRESSION: No mammographic evidence of malignancy. A result letter of this
screening mammogram will be mailed directly to the patient.

RECOMMENDATION:
Screening mammogram in one year. (Code:[id number])

BI-RADS CATEGORY  1: Negative.

## 2020-05-06 ENCOUNTER — Encounter: Payer: Self-pay | Admitting: Gastroenterology

## 2020-05-22 DIAGNOSIS — Z139 Encounter for screening, unspecified: Secondary | ICD-10-CM | POA: Diagnosis not present

## 2020-05-22 DIAGNOSIS — E7800 Pure hypercholesterolemia, unspecified: Secondary | ICD-10-CM | POA: Diagnosis not present

## 2020-05-22 DIAGNOSIS — I10 Essential (primary) hypertension: Secondary | ICD-10-CM | POA: Diagnosis not present

## 2020-05-22 DIAGNOSIS — Z6824 Body mass index (BMI) 24.0-24.9, adult: Secondary | ICD-10-CM | POA: Diagnosis not present

## 2020-05-22 DIAGNOSIS — M810 Age-related osteoporosis without current pathological fracture: Secondary | ICD-10-CM | POA: Diagnosis not present

## 2020-05-29 DIAGNOSIS — E7800 Pure hypercholesterolemia, unspecified: Secondary | ICD-10-CM | POA: Diagnosis not present

## 2020-06-05 ENCOUNTER — Ambulatory Visit
Admission: RE | Admit: 2020-06-05 | Discharge: 2020-06-05 | Disposition: A | Discharge status: Home / Self Care | Payer: Medicare Other | Source: Ambulatory Visit | Attending: Family Medicine | Admitting: Family Medicine

## 2020-06-05 ENCOUNTER — Other Ambulatory Visit: Payer: Self-pay

## 2020-06-05 DIAGNOSIS — Z1231 Encounter for screening mammogram for malignant neoplasm of breast: Secondary | ICD-10-CM | POA: Diagnosis not present

## 2020-09-04 ENCOUNTER — Encounter: Payer: Self-pay | Admitting: Gastroenterology

## 2020-10-10 DIAGNOSIS — U071 COVID-19: Secondary | ICD-10-CM | POA: Diagnosis not present

## 2020-10-24 DIAGNOSIS — E559 Vitamin D deficiency, unspecified: Secondary | ICD-10-CM | POA: Diagnosis not present

## 2020-10-24 DIAGNOSIS — M810 Age-related osteoporosis without current pathological fracture: Secondary | ICD-10-CM | POA: Diagnosis not present

## 2020-10-24 DIAGNOSIS — R5383 Other fatigue: Secondary | ICD-10-CM | POA: Diagnosis not present

## 2020-11-05 DIAGNOSIS — E559 Vitamin D deficiency, unspecified: Secondary | ICD-10-CM | POA: Diagnosis not present

## 2020-11-05 DIAGNOSIS — M810 Age-related osteoporosis without current pathological fracture: Secondary | ICD-10-CM | POA: Diagnosis not present

## 2020-11-05 DIAGNOSIS — Z23 Encounter for immunization: Secondary | ICD-10-CM | POA: Diagnosis not present

## 2020-11-26 DIAGNOSIS — Z1331 Encounter for screening for depression: Secondary | ICD-10-CM | POA: Diagnosis not present

## 2020-11-26 DIAGNOSIS — Z6824 Body mass index (BMI) 24.0-24.9, adult: Secondary | ICD-10-CM | POA: Diagnosis not present

## 2020-11-26 DIAGNOSIS — I10 Essential (primary) hypertension: Secondary | ICD-10-CM | POA: Diagnosis not present

## 2020-11-26 DIAGNOSIS — E7800 Pure hypercholesterolemia, unspecified: Secondary | ICD-10-CM | POA: Diagnosis not present

## 2020-11-26 DIAGNOSIS — M810 Age-related osteoporosis without current pathological fracture: Secondary | ICD-10-CM | POA: Diagnosis not present

## 2020-11-26 DIAGNOSIS — Z9181 History of falling: Secondary | ICD-10-CM | POA: Diagnosis not present

## 2021-05-02 DIAGNOSIS — E559 Vitamin D deficiency, unspecified: Secondary | ICD-10-CM | POA: Diagnosis not present

## 2021-05-02 DIAGNOSIS — R5383 Other fatigue: Secondary | ICD-10-CM | POA: Diagnosis not present

## 2021-05-02 DIAGNOSIS — M810 Age-related osteoporosis without current pathological fracture: Secondary | ICD-10-CM | POA: Diagnosis not present

## 2021-05-07 DIAGNOSIS — M810 Age-related osteoporosis without current pathological fracture: Secondary | ICD-10-CM | POA: Diagnosis not present

## 2021-05-29 DIAGNOSIS — E7800 Pure hypercholesterolemia, unspecified: Secondary | ICD-10-CM | POA: Diagnosis not present

## 2021-05-29 DIAGNOSIS — I10 Essential (primary) hypertension: Secondary | ICD-10-CM | POA: Diagnosis not present

## 2021-05-29 DIAGNOSIS — Z79899 Other long term (current) drug therapy: Secondary | ICD-10-CM | POA: Diagnosis not present

## 2021-05-29 DIAGNOSIS — Z139 Encounter for screening, unspecified: Secondary | ICD-10-CM | POA: Diagnosis not present

## 2021-05-29 DIAGNOSIS — M810 Age-related osteoporosis without current pathological fracture: Secondary | ICD-10-CM | POA: Diagnosis not present

## 2021-05-30 ENCOUNTER — Other Ambulatory Visit: Payer: Self-pay | Admitting: Family Medicine

## 2021-05-30 DIAGNOSIS — Z1231 Encounter for screening mammogram for malignant neoplasm of breast: Secondary | ICD-10-CM

## 2021-05-30 DIAGNOSIS — M8589 Other specified disorders of bone density and structure, multiple sites: Secondary | ICD-10-CM | POA: Diagnosis not present

## 2021-05-30 DIAGNOSIS — Z780 Asymptomatic menopausal state: Secondary | ICD-10-CM | POA: Diagnosis not present

## 2021-05-30 LAB — HM DEXA SCAN

## 2021-06-10 ENCOUNTER — Ambulatory Visit
Admission: RE | Admit: 2021-06-10 | Discharge: 2021-06-10 | Disposition: A | Discharge status: Home / Self Care | Payer: Medicare Other | Source: Ambulatory Visit | Attending: Family Medicine | Admitting: Family Medicine

## 2021-06-10 ENCOUNTER — Ambulatory Visit: Payer: Medicare Other

## 2021-06-10 DIAGNOSIS — Z1231 Encounter for screening mammogram for malignant neoplasm of breast: Secondary | ICD-10-CM | POA: Diagnosis not present

## 2021-07-10 DIAGNOSIS — Z1331 Encounter for screening for depression: Secondary | ICD-10-CM | POA: Diagnosis not present

## 2021-07-10 DIAGNOSIS — Z0000 Encounter for general adult medical examination without abnormal findings: Secondary | ICD-10-CM | POA: Diagnosis not present

## 2021-07-10 DIAGNOSIS — Z9181 History of falling: Secondary | ICD-10-CM | POA: Diagnosis not present

## 2021-07-10 DIAGNOSIS — E785 Hyperlipidemia, unspecified: Secondary | ICD-10-CM | POA: Diagnosis not present

## 2021-10-01 DIAGNOSIS — Z23 Encounter for immunization: Secondary | ICD-10-CM | POA: Diagnosis not present

## 2021-11-17 DIAGNOSIS — E559 Vitamin D deficiency, unspecified: Secondary | ICD-10-CM | POA: Diagnosis not present

## 2021-11-17 DIAGNOSIS — R5383 Other fatigue: Secondary | ICD-10-CM | POA: Diagnosis not present

## 2021-11-17 DIAGNOSIS — M810 Age-related osteoporosis without current pathological fracture: Secondary | ICD-10-CM | POA: Diagnosis not present

## 2021-11-19 DIAGNOSIS — M810 Age-related osteoporosis without current pathological fracture: Secondary | ICD-10-CM | POA: Diagnosis not present

## 2021-12-05 DIAGNOSIS — M810 Age-related osteoporosis without current pathological fracture: Secondary | ICD-10-CM | POA: Diagnosis not present

## 2021-12-05 DIAGNOSIS — I10 Essential (primary) hypertension: Secondary | ICD-10-CM | POA: Diagnosis not present

## 2021-12-05 DIAGNOSIS — Z79899 Other long term (current) drug therapy: Secondary | ICD-10-CM | POA: Diagnosis not present

## 2021-12-05 DIAGNOSIS — Z6823 Body mass index (BMI) 23.0-23.9, adult: Secondary | ICD-10-CM | POA: Diagnosis not present

## 2021-12-05 DIAGNOSIS — E7800 Pure hypercholesterolemia, unspecified: Secondary | ICD-10-CM | POA: Diagnosis not present

## 2022-01-22 DIAGNOSIS — R509 Fever, unspecified: Secondary | ICD-10-CM | POA: Diagnosis not present

## 2022-01-22 DIAGNOSIS — R6889 Other general symptoms and signs: Secondary | ICD-10-CM | POA: Diagnosis not present

## 2022-01-22 DIAGNOSIS — R059 Cough, unspecified: Secondary | ICD-10-CM | POA: Diagnosis not present

## 2022-01-26 DIAGNOSIS — J189 Pneumonia, unspecified organism: Secondary | ICD-10-CM | POA: Diagnosis not present

## 2022-02-16 DIAGNOSIS — J189 Pneumonia, unspecified organism: Secondary | ICD-10-CM | POA: Diagnosis not present

## 2022-04-29 ENCOUNTER — Encounter: Payer: Self-pay | Admitting: Family Medicine

## 2022-04-29 ENCOUNTER — Ambulatory Visit (INDEPENDENT_AMBULATORY_CARE_PROVIDER_SITE_OTHER): Payer: Medicare Other | Admitting: Family Medicine

## 2022-04-29 ENCOUNTER — Telehealth: Payer: Self-pay

## 2022-04-29 VITALS — BP 134/88 | Ht 66.0 in | Wt 135.0 lb

## 2022-04-29 DIAGNOSIS — M810 Age-related osteoporosis without current pathological fracture: Secondary | ICD-10-CM

## 2022-04-29 NOTE — Telephone Encounter (Signed)
[place], [place], LAT  [place], [place], [place] Cc: Annita Brod, CMA [employer],  Can you run benefits for this pt. Her prolia is due on 5/16. Thanks!

## 2022-04-29 NOTE — Progress Notes (Signed)
PCP: Hamrick, Durward Fortes, MD  Subjective:   HPI: Patient is a 72 y.o. female here for osteoporosis.  Patient here as a transfer of care from Dr. Cleophas Dunker to assume osteoporosis care. Been on Prolia for about 10 years without any issues - no other medications for osteoporosis. History of Hip, Spine, or Wrist Fracture: wrist fracture in 2010 Heart disease or stroke: no Cancer: no Kidney Disease: no Gastric/Peptic Ulcer: no Gastric bypass surgery: no Severe GERD: no History of seizures: no Age at Menopause: 3 Calcium intake: in multivitamin Vitamin D intake: in multivitamin Hormone replacement therapy: no Smoking history: former - 10 years , 1 ppd Alcohol: 1-2 per week Exercise: dance 3-4 times per week, walk 3-4 times per week Major dental work in past year: no Parents with hip/spine fracture: no  Past Medical History:  Diagnosis Date   Anemia    Blood transfusion without reported diagnosis    Hypertension    Orthostatic dizziness    Osteoporosis     Current Outpatient Medications on File Prior to Visit  Medication Sig Dispense Refill   denosumab (PROLIA) 60 MG/ML SOLN injection Inject 60 mg into the skin every 6 (six) months. Administer in upper arm, thigh, or abdomen     desonide (DESOWEN) 0.05 % cream Apply 1 application topically 2 (two) times daily as needed (eczema.). For maximum 10 days. 60 g 1   enalapril (VASOTEC) 2.5 MG tablet TAKE 1 TABLET BY MOUTH DAILY. 15 tablet 0   hydrochlorothiazide (HYDRODIURIL) 25 MG tablet TAKE 1 TABLET BY MOUTH DAILY. 15 tablet 0   Multiple Vitamin (MULTIVITAMIN WITH MINERALS) TABS tablet Take 1 tablet by mouth daily.     Current Facility-Administered Medications on File Prior to Visit  Medication Dose Route Frequency Provider Last Rate Last Admin   0.9 %  sodium chloride infusion  500 mL Intravenous Once Meryl Dare, MD        Past Surgical History:  Procedure Laterality Date   CESAREAN SECTION     FRACTURE SURGERY     ankle  and wrist   HALLUX VALGUS CORRECTION      No Known Allergies  BP 134/88   Ht  (1.676 m)   Wt 135 lb (61.2 kg)   BMI 21.79 kg/m       No data to display              No data to display              Objective:  Physical Exam:  Gen: NAD, comfortable in exam room  Dexa 05/30/21 T scores: L fem neck -1.9, R fem neck -1.7, R 1/3 radius -1.4, AP spine +2.5   No recent labwork available for review.  Assessment & Plan:  1. Osteoporosis - has done well on prolia for several years.  Will check vitamin D and calcium to make sure these are normal, plan to continue prolia.  Supplement with calcium and vitamin D.  Continue exercise.    Total visit time 30 minutes including documentation.

## 2022-04-29 NOTE — Telephone Encounter (Signed)
Prolia VOB initiated via [email or web address]  Last OV:  Next OV:  Last Prolia inj:  Next Prolia inj DUE: 05/21/22

## 2022-04-29 NOTE — Patient Instructions (Signed)
Your prolia is due on 5/16. Get labwork so we can make sure your calcium and vitamin D are normal.

## 2022-05-01 NOTE — Telephone Encounter (Signed)
Pt ready for scheduling on or after 05/21/22  Out-of-pocket cost due at time of visit: $0  Primary: Medicare Prolia co-insurance: 20% (approximately $302) Admin fee co-insurance: 20% (approximately $25)  Deductible: $198.97 of $240 met  Secondary: BCBS [place] FEP Prolia co-insurance: Covers Medicare Part B co-insurance Admin fee co-insurance: Covers Medicare Part B co-insurance  Deductible:  Covered by secondary  Prior Auth: NOT required PA# Valid:   ** This summary of benefits is an estimation of the patient's out-of-pocket cost. Exact cost may vary based on individual plan coverage.

## 2022-05-05 DIAGNOSIS — M810 Age-related osteoporosis without current pathological fracture: Secondary | ICD-10-CM | POA: Diagnosis not present

## 2022-05-06 ENCOUNTER — Encounter: Payer: Self-pay | Admitting: *Deleted

## 2022-05-06 LAB — VITAMIN D 25 HYDROXY (VIT D DEFICIENCY, FRACTURES): Vit D, 25-Hydroxy: 64.5 ng/mL (ref 30.0–100.0)

## 2022-05-06 LAB — BASIC METABOLIC PANEL
BUN/Creatinine Ratio: 17 (ref 12–28)
BUN: 13 mg/dL (ref 8–27)
CO2: 24 mmol/L (ref 20–29)
Calcium: 10.1 mg/dL (ref 8.7–10.3)
Chloride: 99 mmol/L (ref 96–106)
Creatinine, Ser: 0.76 mg/dL (ref 0.57–1.00)
Glucose: 87 mg/dL (ref 70–99)
Potassium: 3.8 mmol/L (ref 3.5–5.2)
Sodium: 140 mmol/L (ref 134–144)
eGFR: 84 mL/min/{1.73_m2} (ref >59)

## 2022-05-21 ENCOUNTER — Ambulatory Visit (INDEPENDENT_AMBULATORY_CARE_PROVIDER_SITE_OTHER): Payer: Medicare Other | Admitting: Sports Medicine

## 2022-05-21 DIAGNOSIS — M810 Age-related osteoporosis without current pathological fracture: Secondary | ICD-10-CM

## 2022-05-21 MED ORDER — DENOSUMAB 60 MG/ML ~~LOC~~ SOSY
60.0000 mg | PREFILLED_SYRINGE | Freq: Once | SUBCUTANEOUS | Status: AC
  Administered 2022-05-21: 60 mg via SUBCUTANEOUS

## 2022-05-21 NOTE — Progress Notes (Signed)
Patient given [place] prolia injection 60mg /ml in her left arm. Patient tolerated injection well without reaction at the injection site. Patient will schedule next injection, which is 6 months from today.  Pt given lab orders for BMP and Vit D to have drawn about one week before next prolia injection.

## 2022-06-10 DIAGNOSIS — M810 Age-related osteoporosis without current pathological fracture: Secondary | ICD-10-CM | POA: Diagnosis not present

## 2022-06-10 DIAGNOSIS — E7800 Pure hypercholesterolemia, unspecified: Secondary | ICD-10-CM | POA: Diagnosis not present

## 2022-06-10 DIAGNOSIS — Z6823 Body mass index (BMI) 23.0-23.9, adult: Secondary | ICD-10-CM | POA: Diagnosis not present

## 2022-06-10 DIAGNOSIS — Z139 Encounter for screening, unspecified: Secondary | ICD-10-CM | POA: Diagnosis not present

## 2022-06-10 DIAGNOSIS — I10 Essential (primary) hypertension: Secondary | ICD-10-CM | POA: Diagnosis not present

## 2022-06-10 DIAGNOSIS — Z79899 Other long term (current) drug therapy: Secondary | ICD-10-CM | POA: Diagnosis not present

## 2022-06-22 NOTE — Telephone Encounter (Signed)
Last Prolia inj 05/21/22 Next Prolia inj due 11/22/22 

## 2022-08-03 DIAGNOSIS — I10 Essential (primary) hypertension: Secondary | ICD-10-CM | POA: Diagnosis not present

## 2022-08-03 DIAGNOSIS — E785 Hyperlipidemia, unspecified: Secondary | ICD-10-CM | POA: Diagnosis not present

## 2022-08-03 DIAGNOSIS — T63441A Toxic effect of venom of bees, accidental (unintentional), initial encounter: Secondary | ICD-10-CM | POA: Diagnosis not present

## 2022-08-03 DIAGNOSIS — M810 Age-related osteoporosis without current pathological fracture: Secondary | ICD-10-CM | POA: Diagnosis not present

## 2022-08-06 ENCOUNTER — Other Ambulatory Visit: Payer: Self-pay | Admitting: Family Medicine

## 2022-08-06 DIAGNOSIS — Z1231 Encounter for screening mammogram for malignant neoplasm of breast: Secondary | ICD-10-CM

## 2022-08-11 ENCOUNTER — Ambulatory Visit
Admission: RE | Admit: 2022-08-11 | Discharge: 2022-08-11 | Disposition: A | Discharge status: Home / Self Care | Payer: Medicare Other | Source: Ambulatory Visit | Attending: Family Medicine | Admitting: Family Medicine

## 2022-08-11 DIAGNOSIS — Z1231 Encounter for screening mammogram for malignant neoplasm of breast: Secondary | ICD-10-CM

## 2022-10-22 NOTE — Telephone Encounter (Signed)
Prolia VOB initiated via [email or web address]  Next Prolia inj DUE: 11/22/22

## 2022-10-25 NOTE — Telephone Encounter (Signed)
Pt ready for scheduling on or after 11/22/22  Out-of-pocket cost due at time of visit: $346  Primary: Medicare Prolia co-insurance: 20% (approximately $320.99) Admin fee co-insurance: 20% (approximately $25)  Deductible: $240 of $240 met  Prior Auth: NOT required  Secondary: BCBS [place] FEP Prolia co-insurance: Covers Medicare Part B co-insurance Admin fee co-insurance: Covers Medicare Part B co-insurance  Deductible:  Covered by secondary  Prior Auth: NOT required PA# Valid:   ** This summary of benefits is an estimation of the patient's out-of-pocket cost. Exact cost may vary based on individual plan coverage.

## 2022-10-25 NOTE — Telephone Encounter (Signed)
NO Prior Auth required for [employer]

## 2022-11-20 DIAGNOSIS — Z23 Encounter for immunization: Secondary | ICD-10-CM | POA: Diagnosis not present

## 2022-11-23 ENCOUNTER — Other Ambulatory Visit: Payer: Self-pay | Admitting: Family Medicine

## 2022-11-23 DIAGNOSIS — M810 Age-related osteoporosis without current pathological fracture: Secondary | ICD-10-CM | POA: Diagnosis not present

## 2022-11-24 LAB — BASIC METABOLIC PANEL
BUN/Creatinine Ratio: 18 (ref 12–28)
BUN: 15 mg/dL (ref 8–27)
CO2: 27 mmol/L (ref 20–29)
Calcium: 10 mg/dL (ref 8.7–10.3)
Chloride: 97 mmol/L (ref 96–106)
Creatinine, Ser: 0.82 mg/dL (ref 0.57–1.00)
Glucose: 96 mg/dL (ref 70–99)
Potassium: 4 mmol/L (ref 3.5–5.2)
Sodium: 140 mmol/L (ref 134–144)
eGFR: 76 mL/min/{1.73_m2} (ref >59)

## 2022-11-24 LAB — VITAMIN D 25 HYDROXY (VIT D DEFICIENCY, FRACTURES): Vit D, 25-Hydroxy: 59.9 ng/mL (ref 30.0–100.0)

## 2022-11-25 ENCOUNTER — Ambulatory Visit: Payer: Medicare Other | Admitting: Family Medicine

## 2022-11-26 ENCOUNTER — Ambulatory Visit: Payer: Medicare Other | Admitting: Family Medicine

## 2022-11-27 ENCOUNTER — Ambulatory Visit: Payer: Medicare Other | Admitting: Family Medicine

## 2022-11-27 DIAGNOSIS — M810 Age-related osteoporosis without current pathological fracture: Secondary | ICD-10-CM

## 2022-11-27 MED ORDER — DENOSUMAB 60 MG/ML ~~LOC~~ SOSY
60.0000 mg | PREFILLED_SYRINGE | Freq: Once | SUBCUTANEOUS | Status: AC
  Administered 2022-11-27: 60 mg via SUBCUTANEOUS

## 2022-11-27 NOTE — Telephone Encounter (Signed)
Last Prolia inj 11/27/22 Next Prolia inj due 05/28/23

## 2022-11-27 NOTE — Progress Notes (Signed)
Patient given [place] prolia injection 60mg /ml in her right arm. Patient tolerated injection well without reaction at the injection site. Patient will schedule next injection, which is 6 months from today.

## 2023-03-12 DIAGNOSIS — M25552 Pain in left hip: Secondary | ICD-10-CM | POA: Diagnosis not present

## 2023-03-12 DIAGNOSIS — Z1331 Encounter for screening for depression: Secondary | ICD-10-CM | POA: Diagnosis not present

## 2023-03-12 DIAGNOSIS — Z9181 History of falling: Secondary | ICD-10-CM | POA: Diagnosis not present

## 2023-03-12 DIAGNOSIS — Z79899 Other long term (current) drug therapy: Secondary | ICD-10-CM | POA: Diagnosis not present

## 2023-03-12 DIAGNOSIS — L309 Dermatitis, unspecified: Secondary | ICD-10-CM | POA: Diagnosis not present

## 2023-03-12 DIAGNOSIS — Z23 Encounter for immunization: Secondary | ICD-10-CM | POA: Diagnosis not present

## 2023-03-12 DIAGNOSIS — E7800 Pure hypercholesterolemia, unspecified: Secondary | ICD-10-CM | POA: Diagnosis not present

## 2023-03-12 DIAGNOSIS — I10 Essential (primary) hypertension: Secondary | ICD-10-CM | POA: Diagnosis not present

## 2023-03-12 DIAGNOSIS — M810 Age-related osteoporosis without current pathological fracture: Secondary | ICD-10-CM | POA: Diagnosis not present

## 2023-03-31 ENCOUNTER — Other Ambulatory Visit: Payer: Self-pay | Admitting: *Deleted

## 2023-03-31 DIAGNOSIS — M810 Age-related osteoporosis without current pathological fracture: Secondary | ICD-10-CM

## 2023-03-31 NOTE — Telephone Encounter (Addendum)
 Patient is ready for scheduling on or after: 05/28/23 BUY AND BILL  Out-of-pocket cost due at time of visit: $0  Primary: [place] Medicare Prolia  co-insurance: 20% (approximately $331.87) Admin fee co-insurance: 20% (approximately $25)  Deductible: $257 ($72.52 met)  Prior Auth: not req'd  Secondary: BCBS [place] FEP This is a Market researcher that covers the Medicare Part B coinsurance and deductible.    ** This summary of benefits is an estimation of the patient's out-of-pocket cost. Exact cost may vary based on individual plan coverage.

## 2023-05-25 DIAGNOSIS — M810 Age-related osteoporosis without current pathological fracture: Secondary | ICD-10-CM | POA: Diagnosis not present

## 2023-05-26 ENCOUNTER — Ambulatory Visit: Payer: Self-pay | Admitting: Family Medicine

## 2023-05-26 LAB — BASIC METABOLIC PANEL WITH GFR
BUN/Creatinine Ratio: 19 (ref 12–28)
BUN: 15 mg/dL (ref 8–27)
CO2: 22 mmol/L (ref 20–29)
Calcium: 10.1 mg/dL (ref 8.7–10.3)
Chloride: 101 mmol/L (ref 96–106)
Creatinine, Ser: 0.77 mg/dL (ref 0.57–1.00)
Glucose: 90 mg/dL (ref 70–99)
Potassium: 4 mmol/L (ref 3.5–5.2)
Sodium: 141 mmol/L (ref 134–144)
eGFR: 82 mL/min/{1.73_m2} (ref >59)

## 2023-05-26 LAB — VITAMIN D 25 HYDROXY (VIT D DEFICIENCY, FRACTURES): Vit D, 25-Hydroxy: 66.7 ng/mL (ref 30.0–100.0)

## 2023-06-01 ENCOUNTER — Encounter: Payer: Self-pay | Admitting: Family Medicine

## 2023-06-01 ENCOUNTER — Ambulatory Visit (INDEPENDENT_AMBULATORY_CARE_PROVIDER_SITE_OTHER): Payer: Medicare Other | Admitting: Family Medicine

## 2023-06-01 ENCOUNTER — Ambulatory Visit (INDEPENDENT_AMBULATORY_CARE_PROVIDER_SITE_OTHER): Admitting: Family Medicine

## 2023-06-01 VITALS — BP 138/88 | Ht 66.0 in | Wt 140.0 lb

## 2023-06-01 DIAGNOSIS — M25552 Pain in left hip: Secondary | ICD-10-CM

## 2023-06-01 DIAGNOSIS — M810 Age-related osteoporosis without current pathological fracture: Secondary | ICD-10-CM | POA: Diagnosis not present

## 2023-06-01 MED ORDER — DENOSUMAB 60 MG/ML ~~LOC~~ SOSY
60.0000 mg | PREFILLED_SYRINGE | Freq: Once | SUBCUTANEOUS | Status: AC
  Administered 2023-06-01: 60 mg via SUBCUTANEOUS

## 2023-06-01 NOTE — Progress Notes (Signed)
 DATE OF VISIT: 06/01/2023    Lisa Sloan DOB: December 24, 1950 MRN: [id number]  CC:  Lt hip pain  History of present Illness: Lisa Sloan is a 73 y.o. 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 Administer in upper arm, thigh, or abdomen   desonide  (DESOWEN ) 0.05 % cream Apply 1 application topically 2 (two) times daily as needed (eczema.). For maximum 10 days.   enalapril  (VASOTEC ) 2.5 MG tablet TAKE 1 TABLET BY MOUTH DAILY.   hydrochlorothiazide  (HYDRODIURIL ) 25 MG tablet TAKE 1 TABLET BY MOUTH DAILY.   Multiple Vitamin (MULTIVITAMIN WITH MINERALS) TABS tablet Take 1 tablet by mouth daily.   Facility-Administered Encounter Medications as of 06/01/2023  Medication   0.9 %  sodium chloride  infusion    Allergies: has no known allergies.  Physical Examination: Vitals: BP 138/88   Ht 5\' 6"  (1.676 m)   Wt 140 lb (63.5 kg)   BMI 22.60 kg/m  GENERAL:  Lisa Sloan is a 73 y.o. female appearing their stated age, alert and oriented x 3, in no apparent distress.  SKIN: no rashes or lesions, skin clean, dry, intact MSK:  L spine: No gross deformity.  No midline or paraspinal tenderness.  Good range of motion without pain.  Negative straight leg raise bilaterally Hip: Left hip with full range of motion with mild lateral hip pain in terminal internal and external rotation.  Tender palpation over the greater trochanter.  No tenderness over the anterior posterior hip.  Negative  FADIR, negative FABER.  Hip strength 4/5 throughout. Right hip with full range of motion without pain.  No tenderness over the greater trochanter.  Negative FABER, negative FADIR.  Hip strength 5-/5 throughout. Walking without a limp, no leg length discrepancy NEURO: sensation intact to light touch, DTR 2/4 Achilles and patella bilaterally VASC: pulses 2+ and symmetric lower extremity bilaterally, no edema   Assessment & Plan Greater trochanteric pain syndrome of left lower extremity Acute left lateral hip pain consistent with greater trochanteric pain syndrome and likely gluteal tendinopathy  Plan: - Diagnosis and treatment discussed - Discussed formal PT versus home exercise program.  She prefers HEP.  This was provided.  She does have ankle weights at home.  She can use these as she advances her activity - Recommended heating pad or warm baths as needed - Recommended Voltaren gel every 6-8 hours as needed - Follow-up 6 to 8 weeks if worsening or no improvement, could consider cortisone injection and/or imaging if not improving   Patient expressed understanding & agreement with above.  Encounter Diagnosis  Name Primary?   Greater trochanteric pain syndrome of left lower extremity Yes    No orders of the defined types were placed in this encounter.

## 2023-06-01 NOTE — Progress Notes (Signed)
Patient given [place] prolia injection 60mg/ml in her left lower abdomen. Patient tolerated injection well without reaction at the injection site. Patient will schedule next injection, which is 6 months from today. 

## 2023-06-04 DIAGNOSIS — Z23 Encounter for immunization: Secondary | ICD-10-CM | POA: Diagnosis not present

## 2023-07-13 ENCOUNTER — Ambulatory Visit (INDEPENDENT_AMBULATORY_CARE_PROVIDER_SITE_OTHER): Admitting: Family Medicine

## 2023-07-13 VITALS — BP 122/82 | Ht 66.0 in | Wt 140.0 lb

## 2023-07-13 DIAGNOSIS — M25562 Pain in left knee: Secondary | ICD-10-CM

## 2023-07-13 DIAGNOSIS — M25552 Pain in left hip: Secondary | ICD-10-CM

## 2023-07-13 DIAGNOSIS — M810 Age-related osteoporosis without current pathological fracture: Secondary | ICD-10-CM | POA: Diagnosis not present

## 2023-07-13 NOTE — Progress Notes (Signed)
 DATE OF VISIT: 07/13/2023        Lisa Sloan DOB: 1951-01-04 MRN: [id number]  CC:  f/u Lt hip pain  History of present Illness: Lisa Sloan is a 73 y.o. female who presents for a follow-up visit for Lt hip pain  Last seen by me 06/01/23, diagnosed with left greater enteric pain syndrome and gluteal tendinopathy - Was recommended home exercise program, Voltaren gel Since last visit she reports she is greatly improved Still has some pain with stairs, but is now able to lay on that side Has been doing her home exercises some days of the week, up to every other day She is feeling stronger, but still feeling a little bit weak  She is now starting to notice that she has some left knee pain Denies any injury or trauma Denies any swelling Denies any mechanical symptoms  Medications:  Outpatient Encounter Medications as of 07/13/2023  Medication Sig   denosumab  (PROLIA ) 60 MG/ML SOLN injection Inject 60 mg into the skin every 6 (six) months. Administer in upper arm, thigh, or abdomen   desonide  (DESOWEN ) 0.05 % cream Apply 1 application topically 2 (two) times daily as needed (eczema.). For maximum 10 days.   enalapril  (VASOTEC ) 2.5 MG tablet TAKE 1 TABLET BY MOUTH DAILY.   hydrochlorothiazide  (HYDRODIURIL ) 25 MG tablet TAKE 1 TABLET BY MOUTH DAILY.   Multiple Vitamin (MULTIVITAMIN WITH MINERALS) TABS tablet Take 1 tablet by mouth daily.   Facility-Administered Encounter Medications as of 07/13/2023  Medication   0.9 %  sodium chloride  infusion    Allergies: has no known allergies.  Physical Examination: Vitals: BP 122/82   Ht 5' 6 (1.676 m)   Wt 140 lb (63.5 kg)   BMI 22.60 kg/m  GENERAL:  Lisa Sloan is a 73 y.o. female appearing their stated age, alert and oriented x 3, in no apparent distress.  SKIN: no rashes or lesions, skin clean, dry, intact MSK: Hip: Left hip with full range of motion without pain.  No tenderness over the greater trochanter today.  Hip strength 5  -/5 throughout.  Negative FABER, negative FADIR.  Right hip with good range of motion without pain.  Hip strength 5/5 throughout.  Knee: Left knee without swelling or effusion.  Full range of motion without pain.  No medial or lateral joint line tenderness.  No ligamentous laxity.  Right knee with full range of motion without pain, weakness, instability Walking without a limp Neurovascularly intact distally  Assessment & Plan Greater trochanteric pain syndrome of left lower extremity Left lateral hip pain due to greater trochanteric pain syndrome and gluteal tendinopathy, greatly improved, but still with ongoing hip weakness and pain  Plan: - Patient is pleased with her progress, but still cannot improve her hip strength.  Recommended trying to do her exercises at least 5 to 6 days a week.  Most ideal situation would be doing daily, but okay to miss a day or 2.  She agrees to try to crease her activity and diligence with the home exercises - Can continue to use Voltaren gel as needed - Heat or ice as needed -Follow-up with me on an as basis Acute pain of left knee Acute left knee pain, suspect underlying osteoarthritis, also component of weakness in the thigh and hip girdle  Plan: - Home exercise program provided - Can use Voltaren gel as needed - Heat or ice as needed - Knee sleeve as needed - Follow-up with me if worsening or not  improving, otherwise as needed.  Would consider x-rays if not improving Osteoporosis, unspecified osteoporosis type, unspecified pathological fracture presence Known osteoporosis, has been on Prolia  every 6 months, last injection 06/01/2023  Plan: - Patient inquiring about when she is due for her next DEXA scan.  Last was completed 05/30/2021.  Recommendation is every 2 years.  She has also been undergoing osteoporosis treatment.  Ordered for updated DEXA scan placed today. - She will follow-up with Dr. Cleatrice for her osteoporosis later this year   Patient  expressed understanding & agreement with above.  Encounter Diagnosis  Name Primary?   Osteoporosis, unspecified osteoporosis type, unspecified pathological fracture presence Yes    Orders Placed This Encounter  Procedures   DG BONE DENSITY (DXA)

## 2023-07-14 ENCOUNTER — Encounter: Payer: Self-pay | Admitting: Family Medicine

## 2023-07-14 NOTE — Assessment & Plan Note (Signed)
 Known osteoporosis, has been on Prolia  every 6 months, last injection 06/01/2023  Plan: - Patient inquiring about when she is due for her next DEXA scan.  Last was completed 05/30/2021.  Recommendation is every 2 years.  She has also been undergoing osteoporosis treatment.  Ordered for updated DEXA scan placed today. - She will follow-up with Dr. Cleatrice for her osteoporosis later this year

## 2023-07-28 ENCOUNTER — Encounter

## 2023-08-05 ENCOUNTER — Other Ambulatory Visit: Payer: Self-pay | Admitting: Family Medicine

## 2023-08-05 DIAGNOSIS — Z1231 Encounter for screening mammogram for malignant neoplasm of breast: Secondary | ICD-10-CM

## 2023-08-18 ENCOUNTER — Ambulatory Visit
Admission: RE | Admit: 2023-08-18 | Discharge: 2023-08-18 | Disposition: A | Discharge status: Home / Self Care | Source: Ambulatory Visit

## 2023-08-18 DIAGNOSIS — Z1231 Encounter for screening mammogram for malignant neoplasm of breast: Secondary | ICD-10-CM

## 2023-08-20 DIAGNOSIS — M8589 Other specified disorders of bone density and structure, multiple sites: Secondary | ICD-10-CM | POA: Diagnosis not present

## 2023-08-24 ENCOUNTER — Encounter: Payer: Self-pay | Admitting: Family Medicine

## 2023-09-10 DIAGNOSIS — Z23 Encounter for immunization: Secondary | ICD-10-CM | POA: Diagnosis not present

## 2023-09-13 DIAGNOSIS — Z79899 Other long term (current) drug therapy: Secondary | ICD-10-CM | POA: Diagnosis not present

## 2023-09-13 DIAGNOSIS — M810 Age-related osteoporosis without current pathological fracture: Secondary | ICD-10-CM | POA: Diagnosis not present

## 2023-09-13 DIAGNOSIS — L309 Dermatitis, unspecified: Secondary | ICD-10-CM | POA: Diagnosis not present

## 2023-09-13 DIAGNOSIS — E7800 Pure hypercholesterolemia, unspecified: Secondary | ICD-10-CM | POA: Diagnosis not present

## 2023-09-13 DIAGNOSIS — I10 Essential (primary) hypertension: Secondary | ICD-10-CM | POA: Diagnosis not present

## 2023-09-15 DIAGNOSIS — Z23 Encounter for immunization: Secondary | ICD-10-CM | POA: Diagnosis not present

## 2023-11-11 DIAGNOSIS — Z1331 Encounter for screening for depression: Secondary | ICD-10-CM | POA: Diagnosis not present

## 2023-11-11 DIAGNOSIS — Z9181 History of falling: Secondary | ICD-10-CM | POA: Diagnosis not present

## 2023-11-11 DIAGNOSIS — Z0000 Encounter for general adult medical examination without abnormal findings: Secondary | ICD-10-CM | POA: Diagnosis not present

## 2023-11-18 ENCOUNTER — Other Ambulatory Visit: Payer: Self-pay | Admitting: *Deleted

## 2023-11-18 DIAGNOSIS — M810 Age-related osteoporosis without current pathological fracture: Secondary | ICD-10-CM

## 2023-11-23 NOTE — Telephone Encounter (Signed)
 [facility] [employer]  Patient is ready for scheduling on or after: 11/29/23  Out-of-pocket cost due at time of visit: $0  Primary: [place]  Medicare Prolia  co-insurance: 20% (approximately $352.56) Admin fee co-insurance: 20% (approximately $25)  Deductible: $257 of $257 met  Prior Auth: NOT required  Secondary: BCBS FEP This is a Market researcher that covers the Medicare Part B coinsurance and deductible.   ** This summary of benefits is an estimation of the patient's out-of-pocket cost. Exact cost may vary based on individual plan coverage.

## 2023-11-26 DIAGNOSIS — M810 Age-related osteoporosis without current pathological fracture: Secondary | ICD-10-CM | POA: Diagnosis not present

## 2023-11-27 LAB — BASIC METABOLIC PANEL WITH GFR
BUN/Creatinine Ratio: 16 (ref 12–28)
BUN: 12 mg/dL (ref 8–27)
CO2: 26 mmol/L (ref 20–29)
Calcium: 9.7 mg/dL (ref 8.7–10.3)
Chloride: 99 mmol/L (ref 96–106)
Creatinine, Ser: 0.74 mg/dL (ref 0.57–1.00)
Glucose: 87 mg/dL (ref 70–99)
Potassium: 4.2 mmol/L (ref 3.5–5.2)
Sodium: 141 mmol/L (ref 134–144)
eGFR: 86 mL/min/1.73 (ref >59)

## 2023-11-27 LAB — VITAMIN D 25 HYDROXY (VIT D DEFICIENCY, FRACTURES): Vit D, 25-Hydroxy: 54 ng/mL (ref 30.0–100.0)

## 2023-11-29 ENCOUNTER — Ambulatory Visit: Payer: Self-pay | Admitting: Family Medicine

## 2023-12-07 ENCOUNTER — Ambulatory Visit: Admitting: Family Medicine

## 2023-12-07 DIAGNOSIS — M810 Age-related osteoporosis without current pathological fracture: Secondary | ICD-10-CM | POA: Diagnosis not present

## 2023-12-07 MED ORDER — DENOSUMAB 60 MG/ML ~~LOC~~ SOSY
60.0000 mg | PREFILLED_SYRINGE | Freq: Once | SUBCUTANEOUS | Status: AC
  Administered 2023-12-07: 60 mg via SUBCUTANEOUS

## 2023-12-07 NOTE — Progress Notes (Signed)
Patient given [place] prolia injection 60mg/ml in her left arm. Patient tolerated injection well without reaction at the injection site. Patient will schedule next injection, which is 6 months from today.  

## 2024-06-07 ENCOUNTER — Ambulatory Visit: Admitting: Family Medicine
